# Patient Record
Sex: Male | Born: 1945
Health system: Southern US, Community
[De-identification: ages and names within clinical notes are randomized; demographics above are authoritative.]

## PROBLEM LIST (undated history)

## (undated) DIAGNOSIS — N529 Male erectile dysfunction, unspecified: Secondary | ICD-10-CM

## (undated) DIAGNOSIS — R52 Pain, unspecified: Secondary | ICD-10-CM

## (undated) DIAGNOSIS — D126 Benign neoplasm of colon, unspecified: Secondary | ICD-10-CM

## (undated) DIAGNOSIS — N183 Chronic kidney disease, stage 3 unspecified: Secondary | ICD-10-CM

## (undated) DIAGNOSIS — I2510 Atherosclerotic heart disease of native coronary artery without angina pectoris: Secondary | ICD-10-CM

## (undated) DIAGNOSIS — E119 Type 2 diabetes mellitus without complications: Secondary | ICD-10-CM

## (undated) DIAGNOSIS — R1013 Epigastric pain: Secondary | ICD-10-CM

## (undated) DIAGNOSIS — E785 Hyperlipidemia, unspecified: Secondary | ICD-10-CM

## (undated) DIAGNOSIS — M5430 Sciatica, unspecified side: Secondary | ICD-10-CM

## (undated) DIAGNOSIS — I739 Peripheral vascular disease, unspecified: Secondary | ICD-10-CM

## (undated) DIAGNOSIS — I10 Essential (primary) hypertension: Secondary | ICD-10-CM

## (undated) HISTORY — DX: Benign neoplasm of colon, unspecified: D12.6

## (undated) HISTORY — DX: Type 2 diabetes mellitus without complications: E11.9

## (undated) HISTORY — DX: Sciatica, unspecified side: M54.30

## (undated) HISTORY — PX: COLONOSCOPY: SHX174

## (undated) HISTORY — DX: Essential (primary) hypertension: I10

## (undated) HISTORY — DX: Pain, unspecified: R52

## (undated) HISTORY — DX: Chronic kidney disease, stage 3 unspecified: N18.30

## (undated) HISTORY — DX: Peripheral vascular disease, unspecified: I73.9

## (undated) HISTORY — DX: Male erectile dysfunction, unspecified: N52.9

## (undated) HISTORY — DX: Atherosclerotic heart disease of native coronary artery without angina pectoris: I25.10

## (undated) HISTORY — DX: Hyperlipidemia, unspecified: E78.5

## (undated) HISTORY — DX: Chronic kidney disease, stage 3 (moderate): N18.3

## (undated) HISTORY — DX: Epigastric pain: R10.13

---

## 2000-02-11 ENCOUNTER — Encounter: Admission: RE | Admit: 2000-02-11 | Discharge: 2000-05-11 | Payer: Self-pay | Admitting: Internal Medicine

## 2001-02-03 ENCOUNTER — Other Ambulatory Visit: Admission: RE | Admit: 2001-02-03 | Discharge: 2001-02-03 | Payer: Self-pay | Admitting: Internal Medicine

## 2001-02-11 ENCOUNTER — Encounter: Payer: Self-pay | Admitting: Emergency Medicine

## 2001-02-11 ENCOUNTER — Emergency Department (HOSPITAL_COMMUNITY): Admission: EM | Admit: 2001-02-11 | Discharge: 2001-02-11 | Payer: Self-pay | Admitting: Emergency Medicine

## 2005-11-25 DIAGNOSIS — D126 Benign neoplasm of colon, unspecified: Secondary | ICD-10-CM

## 2005-11-25 HISTORY — DX: Benign neoplasm of colon, unspecified: D12.6

## 2010-11-06 ENCOUNTER — Emergency Department (HOSPITAL_COMMUNITY)
Admission: EM | Admit: 2010-11-06 | Discharge: 2010-11-06 | Payer: Self-pay | Source: Home / Self Care | Admitting: Emergency Medicine

## 2011-06-25 ENCOUNTER — Other Ambulatory Visit: Payer: Self-pay | Admitting: Internal Medicine

## 2011-06-25 DIAGNOSIS — N189 Chronic kidney disease, unspecified: Secondary | ICD-10-CM

## 2011-07-05 ENCOUNTER — Ambulatory Visit
Admission: RE | Admit: 2011-07-05 | Discharge: 2011-07-05 | Disposition: A | Discharge status: Home / Self Care | Payer: Medicare Other | Source: Ambulatory Visit | Attending: Internal Medicine | Admitting: Internal Medicine

## 2011-07-05 DIAGNOSIS — N189 Chronic kidney disease, unspecified: Secondary | ICD-10-CM

## 2014-12-01 ENCOUNTER — Other Ambulatory Visit: Payer: Self-pay | Admitting: Gastroenterology

## 2014-12-13 ENCOUNTER — Encounter (HOSPITAL_COMMUNITY): Payer: Self-pay

## 2014-12-13 ENCOUNTER — Ambulatory Visit (HOSPITAL_COMMUNITY): Admit: 2014-12-13 | Payer: Self-pay | Admitting: Gastroenterology

## 2014-12-13 SURGERY — COLONOSCOPY WITH PROPOFOL
Anesthesia: Monitor Anesthesia Care

## 2014-12-23 DIAGNOSIS — E1121 Type 2 diabetes mellitus with diabetic nephropathy: Secondary | ICD-10-CM | POA: Diagnosis not present

## 2014-12-23 DIAGNOSIS — E1151 Type 2 diabetes mellitus with diabetic peripheral angiopathy without gangrene: Secondary | ICD-10-CM | POA: Diagnosis not present

## 2014-12-23 DIAGNOSIS — I739 Peripheral vascular disease, unspecified: Secondary | ICD-10-CM | POA: Diagnosis not present

## 2014-12-23 DIAGNOSIS — N183 Chronic kidney disease, stage 3 (moderate): Secondary | ICD-10-CM | POA: Diagnosis not present

## 2014-12-23 DIAGNOSIS — I10 Essential (primary) hypertension: Secondary | ICD-10-CM | POA: Diagnosis not present

## 2015-03-22 DIAGNOSIS — I739 Peripheral vascular disease, unspecified: Secondary | ICD-10-CM | POA: Diagnosis not present

## 2015-03-22 DIAGNOSIS — N183 Chronic kidney disease, stage 3 (moderate): Secondary | ICD-10-CM | POA: Diagnosis not present

## 2015-03-22 DIAGNOSIS — E1165 Type 2 diabetes mellitus with hyperglycemia: Secondary | ICD-10-CM | POA: Diagnosis not present

## 2015-03-22 DIAGNOSIS — Z794 Long term (current) use of insulin: Secondary | ICD-10-CM | POA: Diagnosis not present

## 2015-03-22 DIAGNOSIS — Z0000 Encounter for general adult medical examination without abnormal findings: Secondary | ICD-10-CM | POA: Diagnosis not present

## 2015-03-22 DIAGNOSIS — I2510 Atherosclerotic heart disease of native coronary artery without angina pectoris: Secondary | ICD-10-CM | POA: Diagnosis not present

## 2015-03-22 DIAGNOSIS — E1121 Type 2 diabetes mellitus with diabetic nephropathy: Secondary | ICD-10-CM | POA: Diagnosis not present

## 2015-03-22 DIAGNOSIS — E1151 Type 2 diabetes mellitus with diabetic peripheral angiopathy without gangrene: Secondary | ICD-10-CM | POA: Diagnosis not present

## 2015-07-24 DIAGNOSIS — E1121 Type 2 diabetes mellitus with diabetic nephropathy: Secondary | ICD-10-CM | POA: Diagnosis not present

## 2015-07-24 DIAGNOSIS — I739 Peripheral vascular disease, unspecified: Secondary | ICD-10-CM | POA: Diagnosis not present

## 2015-07-24 DIAGNOSIS — I129 Hypertensive chronic kidney disease with stage 1 through stage 4 chronic kidney disease, or unspecified chronic kidney disease: Secondary | ICD-10-CM | POA: Diagnosis not present

## 2015-07-24 DIAGNOSIS — Z794 Long term (current) use of insulin: Secondary | ICD-10-CM | POA: Diagnosis not present

## 2015-07-24 DIAGNOSIS — Z23 Encounter for immunization: Secondary | ICD-10-CM | POA: Diagnosis not present

## 2015-07-24 DIAGNOSIS — N183 Chronic kidney disease, stage 3 (moderate): Secondary | ICD-10-CM | POA: Diagnosis not present

## 2015-07-24 DIAGNOSIS — E1165 Type 2 diabetes mellitus with hyperglycemia: Secondary | ICD-10-CM | POA: Diagnosis not present

## 2015-07-24 DIAGNOSIS — E1151 Type 2 diabetes mellitus with diabetic peripheral angiopathy without gangrene: Secondary | ICD-10-CM | POA: Diagnosis not present

## 2015-12-13 DIAGNOSIS — H5213 Myopia, bilateral: Secondary | ICD-10-CM | POA: Diagnosis not present

## 2015-12-13 DIAGNOSIS — H524 Presbyopia: Secondary | ICD-10-CM | POA: Diagnosis not present

## 2015-12-13 DIAGNOSIS — E119 Type 2 diabetes mellitus without complications: Secondary | ICD-10-CM | POA: Diagnosis not present

## 2015-12-13 DIAGNOSIS — Z7984 Long term (current) use of oral hypoglycemic drugs: Secondary | ICD-10-CM | POA: Diagnosis not present

## 2015-12-13 DIAGNOSIS — Z794 Long term (current) use of insulin: Secondary | ICD-10-CM | POA: Diagnosis not present

## 2015-12-13 DIAGNOSIS — H5210 Myopia, unspecified eye: Secondary | ICD-10-CM | POA: Diagnosis not present

## 2015-12-13 DIAGNOSIS — H25099 Other age-related incipient cataract, unspecified eye: Secondary | ICD-10-CM | POA: Diagnosis not present

## 2015-12-13 DIAGNOSIS — H11159 Pinguecula, unspecified eye: Secondary | ICD-10-CM | POA: Diagnosis not present

## 2015-12-13 DIAGNOSIS — H52223 Regular astigmatism, bilateral: Secondary | ICD-10-CM | POA: Diagnosis not present

## 2015-12-13 DIAGNOSIS — I10 Essential (primary) hypertension: Secondary | ICD-10-CM | POA: Diagnosis not present

## 2015-12-29 DIAGNOSIS — Z794 Long term (current) use of insulin: Secondary | ICD-10-CM | POA: Diagnosis not present

## 2015-12-29 DIAGNOSIS — E1151 Type 2 diabetes mellitus with diabetic peripheral angiopathy without gangrene: Secondary | ICD-10-CM | POA: Diagnosis not present

## 2015-12-29 DIAGNOSIS — I739 Peripheral vascular disease, unspecified: Secondary | ICD-10-CM | POA: Diagnosis not present

## 2015-12-29 DIAGNOSIS — E1165 Type 2 diabetes mellitus with hyperglycemia: Secondary | ICD-10-CM | POA: Diagnosis not present

## 2015-12-29 DIAGNOSIS — N183 Chronic kidney disease, stage 3 (moderate): Secondary | ICD-10-CM | POA: Diagnosis not present

## 2015-12-29 DIAGNOSIS — I129 Hypertensive chronic kidney disease with stage 1 through stage 4 chronic kidney disease, or unspecified chronic kidney disease: Secondary | ICD-10-CM | POA: Diagnosis not present

## 2015-12-29 DIAGNOSIS — R3915 Urgency of urination: Secondary | ICD-10-CM | POA: Diagnosis not present

## 2015-12-29 DIAGNOSIS — E1121 Type 2 diabetes mellitus with diabetic nephropathy: Secondary | ICD-10-CM | POA: Diagnosis not present

## 2016-05-01 DIAGNOSIS — N183 Chronic kidney disease, stage 3 (moderate): Secondary | ICD-10-CM | POA: Diagnosis not present

## 2016-05-01 DIAGNOSIS — Z6838 Body mass index (BMI) 38.0-38.9, adult: Secondary | ICD-10-CM | POA: Diagnosis not present

## 2016-05-01 DIAGNOSIS — Z1389 Encounter for screening for other disorder: Secondary | ICD-10-CM | POA: Diagnosis not present

## 2016-05-01 DIAGNOSIS — Z1211 Encounter for screening for malignant neoplasm of colon: Secondary | ICD-10-CM | POA: Diagnosis not present

## 2016-05-01 DIAGNOSIS — Z0000 Encounter for general adult medical examination without abnormal findings: Secondary | ICD-10-CM | POA: Diagnosis not present

## 2016-05-01 DIAGNOSIS — I739 Peripheral vascular disease, unspecified: Secondary | ICD-10-CM | POA: Diagnosis not present

## 2016-05-01 DIAGNOSIS — E1121 Type 2 diabetes mellitus with diabetic nephropathy: Secondary | ICD-10-CM | POA: Diagnosis not present

## 2016-05-01 DIAGNOSIS — E1151 Type 2 diabetes mellitus with diabetic peripheral angiopathy without gangrene: Secondary | ICD-10-CM | POA: Diagnosis not present

## 2016-05-01 DIAGNOSIS — E7800 Pure hypercholesterolemia, unspecified: Secondary | ICD-10-CM | POA: Diagnosis not present

## 2016-05-01 DIAGNOSIS — I2510 Atherosclerotic heart disease of native coronary artery without angina pectoris: Secondary | ICD-10-CM | POA: Diagnosis not present

## 2016-05-01 DIAGNOSIS — I10 Essential (primary) hypertension: Secondary | ICD-10-CM | POA: Diagnosis not present

## 2016-09-02 DIAGNOSIS — I129 Hypertensive chronic kidney disease with stage 1 through stage 4 chronic kidney disease, or unspecified chronic kidney disease: Secondary | ICD-10-CM | POA: Diagnosis not present

## 2016-09-02 DIAGNOSIS — Z794 Long term (current) use of insulin: Secondary | ICD-10-CM | POA: Diagnosis not present

## 2016-09-02 DIAGNOSIS — I739 Peripheral vascular disease, unspecified: Secondary | ICD-10-CM | POA: Diagnosis not present

## 2016-09-02 DIAGNOSIS — N183 Chronic kidney disease, stage 3 (moderate): Secondary | ICD-10-CM | POA: Diagnosis not present

## 2016-09-02 DIAGNOSIS — E1151 Type 2 diabetes mellitus with diabetic peripheral angiopathy without gangrene: Secondary | ICD-10-CM | POA: Diagnosis not present

## 2016-09-02 DIAGNOSIS — E1121 Type 2 diabetes mellitus with diabetic nephropathy: Secondary | ICD-10-CM | POA: Diagnosis not present

## 2016-09-02 DIAGNOSIS — Z23 Encounter for immunization: Secondary | ICD-10-CM | POA: Diagnosis not present

## 2016-10-30 ENCOUNTER — Other Ambulatory Visit: Payer: Self-pay | Admitting: Gastroenterology

## 2016-12-23 DIAGNOSIS — H524 Presbyopia: Secondary | ICD-10-CM | POA: Diagnosis not present

## 2016-12-23 DIAGNOSIS — E119 Type 2 diabetes mellitus without complications: Secondary | ICD-10-CM | POA: Diagnosis not present

## 2017-01-14 ENCOUNTER — Ambulatory Visit (HOSPITAL_COMMUNITY): Payer: Medicare HMO | Admitting: Registered Nurse

## 2017-01-14 ENCOUNTER — Encounter (HOSPITAL_COMMUNITY)
Admission: RE | Disposition: A | Discharge status: Home / Self Care | Payer: Self-pay | Source: Ambulatory Visit | Attending: Gastroenterology

## 2017-01-14 ENCOUNTER — Encounter (HOSPITAL_COMMUNITY): Payer: Self-pay

## 2017-01-14 ENCOUNTER — Ambulatory Visit (HOSPITAL_COMMUNITY)
Admission: RE | Admit: 2017-01-14 | Discharge: 2017-01-14 | Disposition: A | Discharge status: Home / Self Care | Payer: Medicare HMO | Source: Ambulatory Visit | Attending: Gastroenterology | Admitting: Gastroenterology

## 2017-01-14 DIAGNOSIS — E1151 Type 2 diabetes mellitus with diabetic peripheral angiopathy without gangrene: Secondary | ICD-10-CM | POA: Diagnosis not present

## 2017-01-14 DIAGNOSIS — K621 Rectal polyp: Secondary | ICD-10-CM | POA: Insufficient documentation

## 2017-01-14 DIAGNOSIS — J449 Chronic obstructive pulmonary disease, unspecified: Secondary | ICD-10-CM | POA: Diagnosis not present

## 2017-01-14 DIAGNOSIS — I2510 Atherosclerotic heart disease of native coronary artery without angina pectoris: Secondary | ICD-10-CM | POA: Insufficient documentation

## 2017-01-14 DIAGNOSIS — D120 Benign neoplasm of cecum: Secondary | ICD-10-CM | POA: Diagnosis not present

## 2017-01-14 DIAGNOSIS — E7800 Pure hypercholesterolemia, unspecified: Secondary | ICD-10-CM | POA: Diagnosis not present

## 2017-01-14 DIAGNOSIS — Z1211 Encounter for screening for malignant neoplasm of colon: Secondary | ICD-10-CM | POA: Diagnosis not present

## 2017-01-14 DIAGNOSIS — B182 Chronic viral hepatitis C: Secondary | ICD-10-CM | POA: Diagnosis not present

## 2017-01-14 DIAGNOSIS — D122 Benign neoplasm of ascending colon: Secondary | ICD-10-CM | POA: Insufficient documentation

## 2017-01-14 DIAGNOSIS — D124 Benign neoplasm of descending colon: Secondary | ICD-10-CM | POA: Insufficient documentation

## 2017-01-14 DIAGNOSIS — Z87891 Personal history of nicotine dependence: Secondary | ICD-10-CM | POA: Diagnosis not present

## 2017-01-14 DIAGNOSIS — Z955 Presence of coronary angioplasty implant and graft: Secondary | ICD-10-CM | POA: Diagnosis not present

## 2017-01-14 DIAGNOSIS — Z6838 Body mass index (BMI) 38.0-38.9, adult: Secondary | ICD-10-CM | POA: Insufficient documentation

## 2017-01-14 DIAGNOSIS — Z86010 Personal history of colonic polyps: Secondary | ICD-10-CM | POA: Diagnosis not present

## 2017-01-14 HISTORY — PX: COLONOSCOPY WITH PROPOFOL: SHX5780

## 2017-01-14 LAB — GLUCOSE, CAPILLARY: GLUCOSE-CAPILLARY: 81 mg/dL (ref 65–99)

## 2017-01-14 SURGERY — COLONOSCOPY WITH PROPOFOL
Anesthesia: Monitor Anesthesia Care

## 2017-01-14 MED ORDER — LIDOCAINE 2% (20 MG/ML) 5 ML SYRINGE
INTRAMUSCULAR | Status: AC
  Filled 2017-01-14: qty 5

## 2017-01-14 MED ORDER — ONDANSETRON HCL 4 MG/2ML IJ SOLN
INTRAMUSCULAR | Status: DC | PRN
  Administered 2017-01-14: 4 mg via INTRAVENOUS

## 2017-01-14 MED ORDER — PROPOFOL 10 MG/ML IV BOLUS
INTRAVENOUS | Status: DC | PRN
  Administered 2017-01-14 (×2): 20 mg via INTRAVENOUS

## 2017-01-14 MED ORDER — SODIUM CHLORIDE 0.9 % IV SOLN: INTRAVENOUS | Status: DC

## 2017-01-14 MED ORDER — ONDANSETRON HCL 4 MG/2ML IJ SOLN
INTRAMUSCULAR | Status: AC
  Filled 2017-01-14: qty 2

## 2017-01-14 MED ORDER — PROPOFOL 500 MG/50ML IV EMUL
INTRAVENOUS | Status: DC | PRN
  Administered 2017-01-14: 100 ug/kg/min via INTRAVENOUS

## 2017-01-14 MED ORDER — LACTATED RINGERS IV SOLN
INTRAVENOUS | Status: DC
  Administered 2017-01-14: 1000 mL via INTRAVENOUS

## 2017-01-14 MED ORDER — LIDOCAINE 2% (20 MG/ML) 5 ML SYRINGE
INTRAMUSCULAR | Status: DC | PRN
  Administered 2017-01-14: 50 mg via INTRAVENOUS

## 2017-01-14 MED ORDER — PROPOFOL 10 MG/ML IV BOLUS
INTRAVENOUS | Status: AC
  Filled 2017-01-14: qty 60

## 2017-01-14 SURGICAL SUPPLY — 22 items

## 2017-01-14 NOTE — Transfer of Care (Signed)
Immediate Anesthesia Transfer of Care Note  Patient: Kyle Delgado  Procedure(s) Performed: Procedure(s): COLONOSCOPY WITH PROPOFOL (N/A)  Patient Location: PACU  Anesthesia Type:MAC  Level of Consciousness:  sedated, patient cooperative and responds to stimulation  Airway & Oxygen Therapy:Patient Spontanous Breathing and Patient connected to face mask oxgen  Post-op Assessment:  Report given to PACU RN and Post -op Vital signs reviewed and stable  Post vital signs:  Reviewed and stable  Last Vitals:  Vitals:   01/14/17 0645  BP: (!) 156/80  Pulse: 72  Resp: 15  Temp: 67.8 C    Complications: No apparent anesthesia complications

## 2017-01-14 NOTE — Anesthesia Preprocedure Evaluation (Addendum)
Anesthesia Evaluation  Patient identified by MRN, date of birth, ID band Patient awake    Reviewed: Allergy & Precautions, NPO status , Patient's Chart, lab work & pertinent test results  History of Anesthesia Complications Negative for: history of anesthetic complications  Airway Mallampati: II  TM Distance: >3 FB Neck ROM: Full    Dental  (+) Caps, Missing, Dental Advisory Given   Pulmonary COPD, former smoker,    breath sounds clear to auscultation       Cardiovascular hypertension, Pt. on medications (-) angina+ CAD and + Cardiac Stents (at [facility], no records)   Rhythm:Regular Rate:Normal     Neuro/Psych negative neurological ROS     GI/Hepatic negative GI ROS, Neg liver ROS,   Endo/Other  diabetes (glu 81), Insulin DependentMorbid obesity  Renal/GU Renal InsufficiencyRenal disease     Musculoskeletal   Abdominal (+) + obese,   Peds  Hematology   Anesthesia Other Findings   Reproductive/Obstetrics                            Anesthesia Physical Anesthesia Plan  ASA: III  Anesthesia Plan: MAC   Post-op Pain Management:    Induction: Intravenous  Airway Management Planned: Natural Airway  Additional Equipment:   Intra-op Plan:   Post-operative Plan:   Informed Consent: I have reviewed the patients History and Physical, chart, labs and discussed the procedure including the risks, benefits and alternatives for the proposed anesthesia with the patient or authorized representative who has indicated his/her understanding and acceptance.   Dental advisory given  Plan Discussed with: CRNA and Surgeon  Anesthesia Plan Comments: (Plan routine monitors, MAC)        Anesthesia Quick Evaluation

## 2017-01-14 NOTE — Anesthesia Postprocedure Evaluation (Signed)
Anesthesia Post Note  Patient: Kyle Delgado  Procedure(s) Performed: Procedure(s) (LRB): COLONOSCOPY WITH PROPOFOL (N/A)  Patient location during evaluation: Endoscopy Anesthesia Type: MAC Level of consciousness: awake and alert, oriented and patient cooperative Pain management: pain level controlled Vital Signs Assessment: post-procedure vital signs reviewed and stable Respiratory status: spontaneous breathing, nonlabored ventilation and respiratory function stable Cardiovascular status: blood pressure returned to baseline and stable Postop Assessment: no signs of nausea or vomiting Anesthetic complications: no       Last Vitals:  Vitals:   01/14/17 0645 01/14/17 0835  BP: (!) 156/80 (!) 145/84  Pulse: 72 77  Resp: 15 (!) 25  Temp: 36.6 C 36.6 C    Last Pain:  Vitals:   01/14/17 0835  TempSrc: Axillary                 Edwinna Rochette,E. Alanson Hausmann

## 2017-01-14 NOTE — H&P (Signed)
Procedure: Surveillance colonoscopy. 11/12/2011 normal surveillance colonoscopy was performed. On 08/21/2006 screening colonoscopy was performed with removal of a small pedunculated adenomatous sigmoid colon polyp.  History: The patient is a 71 year old male born Dec 09, 1945. He is scheduled to undergo a surveillance colonoscopy today.  Past medical history: Chronic hepatitis C genotype 1A diagnosed in 2000. Blood transfusion performed in 1977. Interferon plus ribavirin therapy administered from November 2002 until February 2003. Type 2 diabetes mellitus. Coronary artery disease. Drug eluting coronary artery stent placed in the left anterior descending coronary artery in March 2012. Stage III kidney disease. Hypertension. Hypercholesterolemia. Peripheral vascular disease.  Medication allergy: Januvia caused pancreatitis  Exam: The patient is alert and lying comfortably on the endoscopy stretcher. Abdomen is soft and nontender to palpation. Lungs are clear to auscultation. Cardiac exam reveals a regular rhythm.  Plan: Proceed with surveillance colonoscopy

## 2017-01-14 NOTE — Discharge Instructions (Signed)

## 2017-01-14 NOTE — Op Note (Signed)
[facility] Patient Name: Kyle Delgado Procedure Date: 01/14/2017 MRN: [id number] Attending MD: Garlan Fair , MD Date of Birth: [birth year] CSN: [id number] Age: 71 Admit Type: Inpatient Procedure:                Colonoscopy Indications:              High risk colon cancer surveillance: Personal                            history of non-advanced adenoma Providers:                Garlan Fair, MD, Laverta Baltimore RN, RN,                            Cherylynn Ridges, Technician, Lance Coon, CRNA Referring MD:              Medicines:                Propofol per Anesthesia Complications:            No immediate complications. Estimated Blood Loss:     Estimated blood loss was minimal. Procedure:                Pre-Anesthesia Assessment:                           - Prior to the procedure, a History and Physical                            was performed, and patient medications and                            allergies were reviewed. The patient's tolerance of                            previous anesthesia was also reviewed. The risks                            and benefits of the procedure and the sedation                            options and risks were discussed with the patient.                            All questions were answered, and informed consent                            was obtained. Prior Anticoagulants: The patient has                            taken aspirin, last dose was day of procedure. ASA                            Grade Assessment: III - A patient with severe  systemic disease. After reviewing the risks and                            benefits, the patient was deemed in satisfactory                            condition to undergo the procedure.                           After obtaining informed consent, the colonoscope                            was passed under direct vision. Throughout the   procedure, the patient's blood pressure, pulse, and                            oxygen saturations were monitored continuously. The                            EC-[id number]) scope was introduced through                            the anus and advanced to the the cecum, identified                            by appendiceal orifice and ileocecal valve. The                            colonoscopy was performed without difficulty. The                            patient tolerated the procedure well. The quality                            of the bowel preparation was good. The appendiceal                            orifice and the rectum were photographed. Scope In: 8:01:11 AM Scope Out: 8:31:09 AM Scope Withdrawal Time: 0 hours 25 minutes 53 seconds  Total Procedure Duration: 0 hours 29 minutes 58 seconds  Findings:      The perianal and digital rectal examinations were normal.      A 3 mm polyp was found in the cecum. The polyp was sessile. The polyp       was removed with a cold biopsy forceps. Resection and retrieval were       complete.      A 3 mm polyp was found in the ascending colon. The polyp was sessile.       The polyp was removed with a cold biopsy forceps. Resection and       retrieval were complete.      A 5 mm polyp was found in the descending colon. The polyp was sessile.       The polyp was removed with a cold snare. Resection and retrieval were       complete.      A  5 mm polyp was found in the rectum. The polyp was sessile. The polyp       was removed with a cold snare. Resection and retrieval were complete.      The exam was otherwise without abnormality. Impression:               - One 3 mm polyp in the cecum, removed with a cold                            biopsy forceps. Resected and retrieved.                           - One 3 mm polyp in the ascending colon, removed                            with a cold biopsy forceps. Resected and retrieved.                            - One 5 mm polyp in the descending colon, removed                            with a cold snare. Resected and retrieved.                           - One 5 mm polyp in the rectum, removed with a cold                            snare. Resected and retrieved.                           - The examination was otherwise normal. Moderate Sedation:      N/A- Per Anesthesia Care Recommendation:           - Patient has a contact number available for                            emergencies. The signs and symptoms of potential                            delayed complications were discussed with the                            patient. Return to normal activities tomorrow.                            Written discharge instructions were provided to the                            patient.                           - Repeat colonoscopy date to be determined after                            pending pathology results  are reviewed for                            surveillance.                           - Resume previous diet.                           - Continue present medications. Procedure Code(s):        --- Professional ---                           [phone], Colonoscopy, flexible; with removal of                            tumor(s), polyp(s), or other lesion(s) by snare                            technique                           45380, 24, Colonoscopy, flexible; with biopsy,                            single or multiple Diagnosis Code(s):        --- Professional ---                           Z86.010, Personal history of colonic polyps                           D12.0, Benign neoplasm of cecum                           D12.2, Benign neoplasm of ascending colon                           D12.4, Benign neoplasm of descending colon                           K62.1, Rectal polyp CPT copyright 2016 American Medical Association. All rights reserved. The codes documented in this report are preliminary and upon  coder review may  be revised to meet current compliance requirements. Earle Gell, MD Garlan Fair, MD 01/14/2017 8:36:46 AM This report has been signed electronically. Number of Addenda: 0

## 2017-01-15 ENCOUNTER — Encounter (HOSPITAL_COMMUNITY): Payer: Self-pay | Admitting: Gastroenterology

## 2017-01-15 DIAGNOSIS — E1151 Type 2 diabetes mellitus with diabetic peripheral angiopathy without gangrene: Secondary | ICD-10-CM | POA: Diagnosis not present

## 2017-01-15 DIAGNOSIS — Z794 Long term (current) use of insulin: Secondary | ICD-10-CM | POA: Diagnosis not present

## 2017-01-15 DIAGNOSIS — I129 Hypertensive chronic kidney disease with stage 1 through stage 4 chronic kidney disease, or unspecified chronic kidney disease: Secondary | ICD-10-CM | POA: Diagnosis not present

## 2017-01-15 DIAGNOSIS — I739 Peripheral vascular disease, unspecified: Secondary | ICD-10-CM | POA: Diagnosis not present

## 2017-01-15 DIAGNOSIS — N183 Chronic kidney disease, stage 3 (moderate): Secondary | ICD-10-CM | POA: Diagnosis not present

## 2017-01-15 DIAGNOSIS — E1165 Type 2 diabetes mellitus with hyperglycemia: Secondary | ICD-10-CM | POA: Diagnosis not present

## 2017-01-15 DIAGNOSIS — E1121 Type 2 diabetes mellitus with diabetic nephropathy: Secondary | ICD-10-CM | POA: Diagnosis not present

## 2017-05-13 DIAGNOSIS — E1121 Type 2 diabetes mellitus with diabetic nephropathy: Secondary | ICD-10-CM | POA: Diagnosis not present

## 2017-05-13 DIAGNOSIS — I739 Peripheral vascular disease, unspecified: Secondary | ICD-10-CM | POA: Diagnosis not present

## 2017-05-13 DIAGNOSIS — Z0000 Encounter for general adult medical examination without abnormal findings: Secondary | ICD-10-CM | POA: Diagnosis not present

## 2017-05-13 DIAGNOSIS — N183 Chronic kidney disease, stage 3 (moderate): Secondary | ICD-10-CM | POA: Diagnosis not present

## 2017-05-13 DIAGNOSIS — Z6839 Body mass index (BMI) 39.0-39.9, adult: Secondary | ICD-10-CM | POA: Diagnosis not present

## 2017-05-13 DIAGNOSIS — Z1389 Encounter for screening for other disorder: Secondary | ICD-10-CM | POA: Diagnosis not present

## 2017-05-13 DIAGNOSIS — Z794 Long term (current) use of insulin: Secondary | ICD-10-CM | POA: Diagnosis not present

## 2017-05-13 DIAGNOSIS — I129 Hypertensive chronic kidney disease with stage 1 through stage 4 chronic kidney disease, or unspecified chronic kidney disease: Secondary | ICD-10-CM | POA: Diagnosis not present

## 2017-05-13 DIAGNOSIS — E7800 Pure hypercholesterolemia, unspecified: Secondary | ICD-10-CM | POA: Diagnosis not present

## 2017-05-13 DIAGNOSIS — E1151 Type 2 diabetes mellitus with diabetic peripheral angiopathy without gangrene: Secondary | ICD-10-CM | POA: Diagnosis not present

## 2017-05-13 DIAGNOSIS — Z7189 Other specified counseling: Secondary | ICD-10-CM | POA: Diagnosis not present

## 2018-01-07 DIAGNOSIS — H524 Presbyopia: Secondary | ICD-10-CM | POA: Diagnosis not present

## 2018-01-07 DIAGNOSIS — E119 Type 2 diabetes mellitus without complications: Secondary | ICD-10-CM | POA: Diagnosis not present

## 2018-01-13 DIAGNOSIS — E1165 Type 2 diabetes mellitus with hyperglycemia: Secondary | ICD-10-CM | POA: Diagnosis not present

## 2018-01-13 DIAGNOSIS — E1121 Type 2 diabetes mellitus with diabetic nephropathy: Secondary | ICD-10-CM | POA: Diagnosis not present

## 2018-01-13 DIAGNOSIS — I739 Peripheral vascular disease, unspecified: Secondary | ICD-10-CM | POA: Diagnosis not present

## 2018-01-13 DIAGNOSIS — E1151 Type 2 diabetes mellitus with diabetic peripheral angiopathy without gangrene: Secondary | ICD-10-CM | POA: Diagnosis not present

## 2018-01-13 DIAGNOSIS — R400 Somnolence: Secondary | ICD-10-CM | POA: Diagnosis not present

## 2018-01-13 DIAGNOSIS — I129 Hypertensive chronic kidney disease with stage 1 through stage 4 chronic kidney disease, or unspecified chronic kidney disease: Secondary | ICD-10-CM | POA: Diagnosis not present

## 2018-01-13 DIAGNOSIS — N183 Chronic kidney disease, stage 3 (moderate): Secondary | ICD-10-CM | POA: Diagnosis not present

## 2018-01-13 DIAGNOSIS — Z794 Long term (current) use of insulin: Secondary | ICD-10-CM | POA: Diagnosis not present

## 2018-01-20 DIAGNOSIS — E1165 Type 2 diabetes mellitus with hyperglycemia: Secondary | ICD-10-CM | POA: Diagnosis not present

## 2018-01-20 DIAGNOSIS — I2510 Atherosclerotic heart disease of native coronary artery without angina pectoris: Secondary | ICD-10-CM | POA: Diagnosis not present

## 2018-01-20 DIAGNOSIS — F329 Major depressive disorder, single episode, unspecified: Secondary | ICD-10-CM | POA: Diagnosis not present

## 2018-01-20 DIAGNOSIS — N183 Chronic kidney disease, stage 3 (moderate): Secondary | ICD-10-CM | POA: Diagnosis not present

## 2018-01-20 DIAGNOSIS — E1121 Type 2 diabetes mellitus with diabetic nephropathy: Secondary | ICD-10-CM | POA: Diagnosis not present

## 2018-01-20 DIAGNOSIS — Z794 Long term (current) use of insulin: Secondary | ICD-10-CM | POA: Diagnosis not present

## 2018-01-20 DIAGNOSIS — E1151 Type 2 diabetes mellitus with diabetic peripheral angiopathy without gangrene: Secondary | ICD-10-CM | POA: Diagnosis not present

## 2018-02-06 DIAGNOSIS — E1165 Type 2 diabetes mellitus with hyperglycemia: Secondary | ICD-10-CM | POA: Diagnosis not present

## 2018-02-06 DIAGNOSIS — N183 Chronic kidney disease, stage 3 (moderate): Secondary | ICD-10-CM | POA: Diagnosis not present

## 2018-02-06 DIAGNOSIS — Z794 Long term (current) use of insulin: Secondary | ICD-10-CM | POA: Diagnosis not present

## 2018-02-06 DIAGNOSIS — E1121 Type 2 diabetes mellitus with diabetic nephropathy: Secondary | ICD-10-CM | POA: Diagnosis not present

## 2018-02-06 DIAGNOSIS — F329 Major depressive disorder, single episode, unspecified: Secondary | ICD-10-CM | POA: Diagnosis not present

## 2018-02-06 DIAGNOSIS — I2510 Atherosclerotic heart disease of native coronary artery without angina pectoris: Secondary | ICD-10-CM | POA: Diagnosis not present

## 2018-02-06 DIAGNOSIS — I129 Hypertensive chronic kidney disease with stage 1 through stage 4 chronic kidney disease, or unspecified chronic kidney disease: Secondary | ICD-10-CM | POA: Diagnosis not present

## 2018-02-06 DIAGNOSIS — E1151 Type 2 diabetes mellitus with diabetic peripheral angiopathy without gangrene: Secondary | ICD-10-CM | POA: Diagnosis not present

## 2018-05-19 ENCOUNTER — Other Ambulatory Visit: Payer: Self-pay | Admitting: Internal Medicine

## 2018-05-19 ENCOUNTER — Ambulatory Visit
Admission: RE | Admit: 2018-05-19 | Discharge: 2018-05-19 | Disposition: A | Discharge status: Home / Self Care | Payer: Medicare HMO | Source: Ambulatory Visit | Attending: Internal Medicine | Admitting: Internal Medicine

## 2018-05-19 DIAGNOSIS — M25551 Pain in right hip: Secondary | ICD-10-CM

## 2018-05-19 DIAGNOSIS — I739 Peripheral vascular disease, unspecified: Secondary | ICD-10-CM | POA: Diagnosis not present

## 2018-05-19 DIAGNOSIS — I129 Hypertensive chronic kidney disease with stage 1 through stage 4 chronic kidney disease, or unspecified chronic kidney disease: Secondary | ICD-10-CM | POA: Diagnosis not present

## 2018-05-19 DIAGNOSIS — G4733 Obstructive sleep apnea (adult) (pediatric): Secondary | ICD-10-CM | POA: Diagnosis not present

## 2018-05-19 DIAGNOSIS — E7800 Pure hypercholesterolemia, unspecified: Secondary | ICD-10-CM | POA: Diagnosis not present

## 2018-05-19 DIAGNOSIS — Z7189 Other specified counseling: Secondary | ICD-10-CM | POA: Diagnosis not present

## 2018-05-19 DIAGNOSIS — E1151 Type 2 diabetes mellitus with diabetic peripheral angiopathy without gangrene: Secondary | ICD-10-CM | POA: Diagnosis not present

## 2018-05-19 DIAGNOSIS — E1121 Type 2 diabetes mellitus with diabetic nephropathy: Secondary | ICD-10-CM | POA: Diagnosis not present

## 2018-05-19 DIAGNOSIS — N183 Chronic kidney disease, stage 3 (moderate): Secondary | ICD-10-CM | POA: Diagnosis not present

## 2018-05-19 DIAGNOSIS — M1611 Unilateral primary osteoarthritis, right hip: Secondary | ICD-10-CM | POA: Diagnosis not present

## 2018-05-19 DIAGNOSIS — E1122 Type 2 diabetes mellitus with diabetic chronic kidney disease: Secondary | ICD-10-CM | POA: Diagnosis not present

## 2018-05-19 DIAGNOSIS — Z794 Long term (current) use of insulin: Secondary | ICD-10-CM | POA: Diagnosis not present

## 2018-05-19 DIAGNOSIS — F329 Major depressive disorder, single episode, unspecified: Secondary | ICD-10-CM | POA: Diagnosis not present

## 2018-05-19 DIAGNOSIS — Z0000 Encounter for general adult medical examination without abnormal findings: Secondary | ICD-10-CM | POA: Diagnosis not present

## 2018-05-19 DIAGNOSIS — E1165 Type 2 diabetes mellitus with hyperglycemia: Secondary | ICD-10-CM | POA: Diagnosis not present

## 2018-05-19 DIAGNOSIS — Z1389 Encounter for screening for other disorder: Secondary | ICD-10-CM | POA: Diagnosis not present

## 2018-06-23 DIAGNOSIS — I2510 Atherosclerotic heart disease of native coronary artery without angina pectoris: Secondary | ICD-10-CM | POA: Diagnosis not present

## 2018-06-23 DIAGNOSIS — F329 Major depressive disorder, single episode, unspecified: Secondary | ICD-10-CM | POA: Diagnosis not present

## 2018-06-23 DIAGNOSIS — N183 Chronic kidney disease, stage 3 (moderate): Secondary | ICD-10-CM | POA: Diagnosis not present

## 2018-06-23 DIAGNOSIS — E1151 Type 2 diabetes mellitus with diabetic peripheral angiopathy without gangrene: Secondary | ICD-10-CM | POA: Diagnosis not present

## 2018-06-23 DIAGNOSIS — E1121 Type 2 diabetes mellitus with diabetic nephropathy: Secondary | ICD-10-CM | POA: Diagnosis not present

## 2018-06-23 DIAGNOSIS — Z794 Long term (current) use of insulin: Secondary | ICD-10-CM | POA: Diagnosis not present

## 2018-09-23 DIAGNOSIS — I10 Essential (primary) hypertension: Secondary | ICD-10-CM | POA: Diagnosis not present

## 2018-09-23 DIAGNOSIS — G4733 Obstructive sleep apnea (adult) (pediatric): Secondary | ICD-10-CM | POA: Diagnosis not present

## 2018-09-23 DIAGNOSIS — E1121 Type 2 diabetes mellitus with diabetic nephropathy: Secondary | ICD-10-CM | POA: Diagnosis not present

## 2018-10-21 DIAGNOSIS — I129 Hypertensive chronic kidney disease with stage 1 through stage 4 chronic kidney disease, or unspecified chronic kidney disease: Secondary | ICD-10-CM | POA: Diagnosis not present

## 2018-10-21 DIAGNOSIS — I2510 Atherosclerotic heart disease of native coronary artery without angina pectoris: Secondary | ICD-10-CM | POA: Diagnosis not present

## 2018-10-21 DIAGNOSIS — N183 Chronic kidney disease, stage 3 (moderate): Secondary | ICD-10-CM | POA: Diagnosis not present

## 2018-10-21 DIAGNOSIS — F329 Major depressive disorder, single episode, unspecified: Secondary | ICD-10-CM | POA: Diagnosis not present

## 2018-10-21 DIAGNOSIS — E1151 Type 2 diabetes mellitus with diabetic peripheral angiopathy without gangrene: Secondary | ICD-10-CM | POA: Diagnosis not present

## 2018-10-21 DIAGNOSIS — Z794 Long term (current) use of insulin: Secondary | ICD-10-CM | POA: Diagnosis not present

## 2018-10-21 DIAGNOSIS — E1121 Type 2 diabetes mellitus with diabetic nephropathy: Secondary | ICD-10-CM | POA: Diagnosis not present

## 2018-10-21 DIAGNOSIS — E1165 Type 2 diabetes mellitus with hyperglycemia: Secondary | ICD-10-CM | POA: Diagnosis not present

## 2018-11-24 DIAGNOSIS — E1121 Type 2 diabetes mellitus with diabetic nephropathy: Secondary | ICD-10-CM | POA: Diagnosis not present

## 2018-11-24 DIAGNOSIS — I129 Hypertensive chronic kidney disease with stage 1 through stage 4 chronic kidney disease, or unspecified chronic kidney disease: Secondary | ICD-10-CM | POA: Diagnosis not present

## 2018-11-24 DIAGNOSIS — F329 Major depressive disorder, single episode, unspecified: Secondary | ICD-10-CM | POA: Diagnosis not present

## 2018-11-24 DIAGNOSIS — E1151 Type 2 diabetes mellitus with diabetic peripheral angiopathy without gangrene: Secondary | ICD-10-CM | POA: Diagnosis not present

## 2018-11-24 DIAGNOSIS — I2510 Atherosclerotic heart disease of native coronary artery without angina pectoris: Secondary | ICD-10-CM | POA: Diagnosis not present

## 2018-11-24 DIAGNOSIS — N183 Chronic kidney disease, stage 3 (moderate): Secondary | ICD-10-CM | POA: Diagnosis not present

## 2018-11-24 DIAGNOSIS — E1165 Type 2 diabetes mellitus with hyperglycemia: Secondary | ICD-10-CM | POA: Diagnosis not present

## 2019-01-12 DIAGNOSIS — H25099 Other age-related incipient cataract, unspecified eye: Secondary | ICD-10-CM | POA: Diagnosis not present

## 2019-01-12 DIAGNOSIS — Z794 Long term (current) use of insulin: Secondary | ICD-10-CM | POA: Diagnosis not present

## 2019-01-12 DIAGNOSIS — H5203 Hypermetropia, bilateral: Secondary | ICD-10-CM | POA: Diagnosis not present

## 2019-01-12 DIAGNOSIS — E119 Type 2 diabetes mellitus without complications: Secondary | ICD-10-CM | POA: Diagnosis not present

## 2019-01-12 DIAGNOSIS — Z0100 Encounter for examination of eyes and vision without abnormal findings: Secondary | ICD-10-CM | POA: Diagnosis not present

## 2019-01-12 DIAGNOSIS — H524 Presbyopia: Secondary | ICD-10-CM | POA: Diagnosis not present

## 2019-01-12 DIAGNOSIS — I10 Essential (primary) hypertension: Secondary | ICD-10-CM | POA: Diagnosis not present

## 2019-01-12 DIAGNOSIS — H52223 Regular astigmatism, bilateral: Secondary | ICD-10-CM | POA: Diagnosis not present

## 2019-01-12 DIAGNOSIS — H11159 Pinguecula, unspecified eye: Secondary | ICD-10-CM | POA: Diagnosis not present

## 2019-01-21 DIAGNOSIS — E1151 Type 2 diabetes mellitus with diabetic peripheral angiopathy without gangrene: Secondary | ICD-10-CM | POA: Diagnosis not present

## 2019-01-21 DIAGNOSIS — E1121 Type 2 diabetes mellitus with diabetic nephropathy: Secondary | ICD-10-CM | POA: Diagnosis not present

## 2019-01-21 DIAGNOSIS — I739 Peripheral vascular disease, unspecified: Secondary | ICD-10-CM | POA: Diagnosis not present

## 2019-01-21 DIAGNOSIS — E1122 Type 2 diabetes mellitus with diabetic chronic kidney disease: Secondary | ICD-10-CM | POA: Diagnosis not present

## 2019-01-21 DIAGNOSIS — N183 Chronic kidney disease, stage 3 (moderate): Secondary | ICD-10-CM | POA: Diagnosis not present

## 2019-01-21 DIAGNOSIS — Z794 Long term (current) use of insulin: Secondary | ICD-10-CM | POA: Diagnosis not present

## 2019-01-21 DIAGNOSIS — I10 Essential (primary) hypertension: Secondary | ICD-10-CM | POA: Diagnosis not present

## 2019-02-19 DIAGNOSIS — E1165 Type 2 diabetes mellitus with hyperglycemia: Secondary | ICD-10-CM | POA: Diagnosis not present

## 2019-02-19 DIAGNOSIS — I129 Hypertensive chronic kidney disease with stage 1 through stage 4 chronic kidney disease, or unspecified chronic kidney disease: Secondary | ICD-10-CM | POA: Diagnosis not present

## 2019-02-19 DIAGNOSIS — F329 Major depressive disorder, single episode, unspecified: Secondary | ICD-10-CM | POA: Diagnosis not present

## 2019-02-19 DIAGNOSIS — I2510 Atherosclerotic heart disease of native coronary artery without angina pectoris: Secondary | ICD-10-CM | POA: Diagnosis not present

## 2019-02-19 DIAGNOSIS — Z794 Long term (current) use of insulin: Secondary | ICD-10-CM | POA: Diagnosis not present

## 2019-02-19 DIAGNOSIS — E1121 Type 2 diabetes mellitus with diabetic nephropathy: Secondary | ICD-10-CM | POA: Diagnosis not present

## 2019-02-19 DIAGNOSIS — E1151 Type 2 diabetes mellitus with diabetic peripheral angiopathy without gangrene: Secondary | ICD-10-CM | POA: Diagnosis not present

## 2019-02-19 DIAGNOSIS — N183 Chronic kidney disease, stage 3 (moderate): Secondary | ICD-10-CM | POA: Diagnosis not present

## 2019-04-22 DIAGNOSIS — Z794 Long term (current) use of insulin: Secondary | ICD-10-CM | POA: Diagnosis not present

## 2019-04-22 DIAGNOSIS — E1165 Type 2 diabetes mellitus with hyperglycemia: Secondary | ICD-10-CM | POA: Diagnosis not present

## 2019-04-22 DIAGNOSIS — E1151 Type 2 diabetes mellitus with diabetic peripheral angiopathy without gangrene: Secondary | ICD-10-CM | POA: Diagnosis not present

## 2019-04-22 DIAGNOSIS — F329 Major depressive disorder, single episode, unspecified: Secondary | ICD-10-CM | POA: Diagnosis not present

## 2019-04-22 DIAGNOSIS — I129 Hypertensive chronic kidney disease with stage 1 through stage 4 chronic kidney disease, or unspecified chronic kidney disease: Secondary | ICD-10-CM | POA: Diagnosis not present

## 2019-04-22 DIAGNOSIS — N183 Chronic kidney disease, stage 3 (moderate): Secondary | ICD-10-CM | POA: Diagnosis not present

## 2019-04-22 DIAGNOSIS — E1121 Type 2 diabetes mellitus with diabetic nephropathy: Secondary | ICD-10-CM | POA: Diagnosis not present

## 2019-04-22 DIAGNOSIS — I2510 Atherosclerotic heart disease of native coronary artery without angina pectoris: Secondary | ICD-10-CM | POA: Diagnosis not present

## 2019-05-26 DIAGNOSIS — Z1389 Encounter for screening for other disorder: Secondary | ICD-10-CM | POA: Diagnosis not present

## 2019-05-26 DIAGNOSIS — Z0000 Encounter for general adult medical examination without abnormal findings: Secondary | ICD-10-CM | POA: Diagnosis not present

## 2019-06-16 DIAGNOSIS — N183 Chronic kidney disease, stage 3 (moderate): Secondary | ICD-10-CM | POA: Diagnosis not present

## 2019-06-16 DIAGNOSIS — I129 Hypertensive chronic kidney disease with stage 1 through stage 4 chronic kidney disease, or unspecified chronic kidney disease: Secondary | ICD-10-CM | POA: Diagnosis not present

## 2019-06-16 DIAGNOSIS — E1151 Type 2 diabetes mellitus with diabetic peripheral angiopathy without gangrene: Secondary | ICD-10-CM | POA: Diagnosis not present

## 2019-06-16 DIAGNOSIS — Z794 Long term (current) use of insulin: Secondary | ICD-10-CM | POA: Diagnosis not present

## 2019-06-16 DIAGNOSIS — E1121 Type 2 diabetes mellitus with diabetic nephropathy: Secondary | ICD-10-CM | POA: Diagnosis not present

## 2019-06-16 DIAGNOSIS — G4733 Obstructive sleep apnea (adult) (pediatric): Secondary | ICD-10-CM | POA: Diagnosis not present

## 2019-06-16 DIAGNOSIS — I739 Peripheral vascular disease, unspecified: Secondary | ICD-10-CM | POA: Diagnosis not present

## 2019-06-16 DIAGNOSIS — E7800 Pure hypercholesterolemia, unspecified: Secondary | ICD-10-CM | POA: Diagnosis not present

## 2019-06-25 DIAGNOSIS — E1121 Type 2 diabetes mellitus with diabetic nephropathy: Secondary | ICD-10-CM | POA: Diagnosis not present

## 2019-06-25 DIAGNOSIS — I129 Hypertensive chronic kidney disease with stage 1 through stage 4 chronic kidney disease, or unspecified chronic kidney disease: Secondary | ICD-10-CM | POA: Diagnosis not present

## 2019-06-25 DIAGNOSIS — E1165 Type 2 diabetes mellitus with hyperglycemia: Secondary | ICD-10-CM | POA: Diagnosis not present

## 2019-06-25 DIAGNOSIS — I2510 Atherosclerotic heart disease of native coronary artery without angina pectoris: Secondary | ICD-10-CM | POA: Diagnosis not present

## 2019-06-25 DIAGNOSIS — F329 Major depressive disorder, single episode, unspecified: Secondary | ICD-10-CM | POA: Diagnosis not present

## 2019-06-25 DIAGNOSIS — Z794 Long term (current) use of insulin: Secondary | ICD-10-CM | POA: Diagnosis not present

## 2019-06-25 DIAGNOSIS — E1151 Type 2 diabetes mellitus with diabetic peripheral angiopathy without gangrene: Secondary | ICD-10-CM | POA: Diagnosis not present

## 2019-06-25 DIAGNOSIS — N183 Chronic kidney disease, stage 3 (moderate): Secondary | ICD-10-CM | POA: Diagnosis not present

## 2019-07-06 DIAGNOSIS — E1151 Type 2 diabetes mellitus with diabetic peripheral angiopathy without gangrene: Secondary | ICD-10-CM | POA: Diagnosis not present

## 2019-07-06 DIAGNOSIS — F329 Major depressive disorder, single episode, unspecified: Secondary | ICD-10-CM | POA: Diagnosis not present

## 2019-07-06 DIAGNOSIS — I2510 Atherosclerotic heart disease of native coronary artery without angina pectoris: Secondary | ICD-10-CM | POA: Diagnosis not present

## 2019-07-06 DIAGNOSIS — E1165 Type 2 diabetes mellitus with hyperglycemia: Secondary | ICD-10-CM | POA: Diagnosis not present

## 2019-07-06 DIAGNOSIS — N183 Chronic kidney disease, stage 3 (moderate): Secondary | ICD-10-CM | POA: Diagnosis not present

## 2019-07-06 DIAGNOSIS — Z794 Long term (current) use of insulin: Secondary | ICD-10-CM | POA: Diagnosis not present

## 2019-07-06 DIAGNOSIS — I129 Hypertensive chronic kidney disease with stage 1 through stage 4 chronic kidney disease, or unspecified chronic kidney disease: Secondary | ICD-10-CM | POA: Diagnosis not present

## 2019-07-06 DIAGNOSIS — E1121 Type 2 diabetes mellitus with diabetic nephropathy: Secondary | ICD-10-CM | POA: Diagnosis not present

## 2019-08-18 DIAGNOSIS — Z794 Long term (current) use of insulin: Secondary | ICD-10-CM | POA: Diagnosis not present

## 2019-08-18 DIAGNOSIS — E1151 Type 2 diabetes mellitus with diabetic peripheral angiopathy without gangrene: Secondary | ICD-10-CM | POA: Diagnosis not present

## 2019-08-18 DIAGNOSIS — I129 Hypertensive chronic kidney disease with stage 1 through stage 4 chronic kidney disease, or unspecified chronic kidney disease: Secondary | ICD-10-CM | POA: Diagnosis not present

## 2019-08-18 DIAGNOSIS — I2510 Atherosclerotic heart disease of native coronary artery without angina pectoris: Secondary | ICD-10-CM | POA: Diagnosis not present

## 2019-08-18 DIAGNOSIS — N183 Chronic kidney disease, stage 3 (moderate): Secondary | ICD-10-CM | POA: Diagnosis not present

## 2019-08-18 DIAGNOSIS — E1165 Type 2 diabetes mellitus with hyperglycemia: Secondary | ICD-10-CM | POA: Diagnosis not present

## 2019-08-18 DIAGNOSIS — Z7984 Long term (current) use of oral hypoglycemic drugs: Secondary | ICD-10-CM | POA: Diagnosis not present

## 2019-08-18 DIAGNOSIS — E1121 Type 2 diabetes mellitus with diabetic nephropathy: Secondary | ICD-10-CM | POA: Diagnosis not present

## 2019-08-18 DIAGNOSIS — F329 Major depressive disorder, single episode, unspecified: Secondary | ICD-10-CM | POA: Diagnosis not present

## 2019-09-23 DIAGNOSIS — E1121 Type 2 diabetes mellitus with diabetic nephropathy: Secondary | ICD-10-CM | POA: Diagnosis not present

## 2019-09-23 DIAGNOSIS — E1165 Type 2 diabetes mellitus with hyperglycemia: Secondary | ICD-10-CM | POA: Diagnosis not present

## 2019-09-23 DIAGNOSIS — N183 Chronic kidney disease, stage 3 unspecified: Secondary | ICD-10-CM | POA: Diagnosis not present

## 2019-09-23 DIAGNOSIS — E1151 Type 2 diabetes mellitus with diabetic peripheral angiopathy without gangrene: Secondary | ICD-10-CM | POA: Diagnosis not present

## 2019-09-23 DIAGNOSIS — I2510 Atherosclerotic heart disease of native coronary artery without angina pectoris: Secondary | ICD-10-CM | POA: Diagnosis not present

## 2019-09-23 DIAGNOSIS — E7800 Pure hypercholesterolemia, unspecified: Secondary | ICD-10-CM | POA: Diagnosis not present

## 2019-09-23 DIAGNOSIS — F329 Major depressive disorder, single episode, unspecified: Secondary | ICD-10-CM | POA: Diagnosis not present

## 2019-09-23 DIAGNOSIS — I129 Hypertensive chronic kidney disease with stage 1 through stage 4 chronic kidney disease, or unspecified chronic kidney disease: Secondary | ICD-10-CM | POA: Diagnosis not present

## 2019-10-26 DIAGNOSIS — E1151 Type 2 diabetes mellitus with diabetic peripheral angiopathy without gangrene: Secondary | ICD-10-CM | POA: Diagnosis not present

## 2019-10-26 DIAGNOSIS — Z794 Long term (current) use of insulin: Secondary | ICD-10-CM | POA: Diagnosis not present

## 2019-10-26 DIAGNOSIS — I739 Peripheral vascular disease, unspecified: Secondary | ICD-10-CM | POA: Diagnosis not present

## 2019-10-26 DIAGNOSIS — N1832 Chronic kidney disease, stage 3b: Secondary | ICD-10-CM | POA: Diagnosis not present

## 2019-10-26 DIAGNOSIS — I129 Hypertensive chronic kidney disease with stage 1 through stage 4 chronic kidney disease, or unspecified chronic kidney disease: Secondary | ICD-10-CM | POA: Diagnosis not present

## 2019-10-26 DIAGNOSIS — E1121 Type 2 diabetes mellitus with diabetic nephropathy: Secondary | ICD-10-CM | POA: Diagnosis not present

## 2019-10-28 ENCOUNTER — Encounter: Payer: Self-pay | Admitting: Cardiovascular Disease

## 2019-10-28 DIAGNOSIS — E1151 Type 2 diabetes mellitus with diabetic peripheral angiopathy without gangrene: Secondary | ICD-10-CM | POA: Diagnosis not present

## 2019-10-28 DIAGNOSIS — N1832 Chronic kidney disease, stage 3b: Secondary | ICD-10-CM | POA: Diagnosis not present

## 2019-11-03 ENCOUNTER — Encounter: Payer: Self-pay | Admitting: Cardiovascular Disease

## 2019-11-03 ENCOUNTER — Other Ambulatory Visit: Payer: Self-pay

## 2019-11-03 ENCOUNTER — Ambulatory Visit (INDEPENDENT_AMBULATORY_CARE_PROVIDER_SITE_OTHER): Payer: Medicare HMO | Admitting: Cardiovascular Disease

## 2019-11-03 VITALS — BP 132/70 | HR 80 | Temp 98.1°F | Ht 69.0 in | Wt 264.0 lb

## 2019-11-03 DIAGNOSIS — E119 Type 2 diabetes mellitus without complications: Secondary | ICD-10-CM | POA: Diagnosis not present

## 2019-11-03 DIAGNOSIS — G4733 Obstructive sleep apnea (adult) (pediatric): Secondary | ICD-10-CM

## 2019-11-03 DIAGNOSIS — N183 Chronic kidney disease, stage 3 unspecified: Secondary | ICD-10-CM | POA: Diagnosis not present

## 2019-11-03 DIAGNOSIS — I10 Essential (primary) hypertension: Secondary | ICD-10-CM | POA: Diagnosis not present

## 2019-11-03 DIAGNOSIS — I2510 Atherosclerotic heart disease of native coronary artery without angina pectoris: Secondary | ICD-10-CM | POA: Diagnosis not present

## 2019-11-03 DIAGNOSIS — E785 Hyperlipidemia, unspecified: Secondary | ICD-10-CM | POA: Insufficient documentation

## 2019-11-03 DIAGNOSIS — I739 Peripheral vascular disease, unspecified: Secondary | ICD-10-CM | POA: Diagnosis not present

## 2019-11-03 DIAGNOSIS — Z794 Long term (current) use of insulin: Secondary | ICD-10-CM

## 2019-11-03 DIAGNOSIS — E782 Mixed hyperlipidemia: Secondary | ICD-10-CM

## 2019-11-03 NOTE — [place] Notes (Signed)
11/03/2019 Kyle Delgado   Mar 07, 1946  [id number]  Primary Physician Kyle Orn, MD Primary Cardiologist: Kyle Harp MD Kyle Delgado, [place]  HPI:  Kyle Delgado is a 73 y.o. severely overweight single African-American male with no children who is retired from working at [employer] .  He was referred by Kyle Delgado for second opinion for evaluation treatment of PAD.  His risk factors include remote tobacco abuse, treated hypertension, diabetes and hyperlipidemia.  Is never had a heart attack or stroke.  There is no family history of heart disease.  He denies chest pain or shortness of breath.  He does have obstructive sleep apnea intolerant to CPAP.  He had stenting of his LAD March 2012 in [place] and is asymptomatic from this.  Over the last year or 2 has had lifestyle limiting claudication with work-up performed at the [facility] thought not to be a candidate for intervention because of CKD 3.  He is referred here for second opinion.   Current Meds  Medication Sig  . aspirin 81 MG tablet Take 81 mg by mouth daily.  . insulin aspart protamine- aspart (NOVOLOG MIX 70/30) (70-30) 100 UNIT/ML injection Inject 70 Units into the skin 2 (two) times daily.  [name] lisinopril (PRINIVIL,ZESTRIL) 40 MG tablet Take 20 mg by mouth daily.   . NON FORMULARY Insulin Pen Needle 31G X 8 MM Miscellaneous as directed  . rosuvastatin (CRESTOR) 20 MG tablet Take 20 mg by mouth daily.  . sildenafil (VIAGRA) 100 MG tablet Take 100 mg by mouth daily as needed for erectile dysfunction.     No Known Allergies  Social History   Socioeconomic History  . Marital status: Single    Spouse name: Not on file  . Number of children: Not on file  . Years of education: Not on file  . Highest education level: Not on file  Occupational History  . Not on file  Social Needs  . Financial resource strain: Not on file  . Food insecurity    Worry: Not on file    Inability: Not on file  . Transportation  needs    Medical: Not on file    Non-medical: Not on file  Tobacco Use  . Smoking status: Former Smoker    Packs/day: 1.00    Types: Cigarettes    Quit date: 2008    Years since quitting: 12.9  . Smokeless tobacco: Never Used  Substance and Sexual Activity  . Alcohol use: No  . Drug use: No  . Sexual activity: Not on file  Lifestyle  . Physical activity    Days per week: Not on file    Minutes per session: Not on file  . Stress: Not on file  Relationships  . Social Herbalist on phone: Not on file    Gets together: Not on file    Attends religious service: Not on file    Active member of club or organization: Not on file    Attends meetings of clubs or organizations: Not on file    Relationship status: Not on file  . Intimate partner violence    Fear of current or ex partner: Not on file    Emotionally abused: Not on file    Physically abused: Not on file    Forced sexual activity: Not on file  Other Topics Concern  . Not on file  Social History Narrative  . Not on file     Review  of Systems: General: negative for chills, fever, night sweats or weight changes.  Cardiovascular: negative for chest pain, dyspnea on exertion, edema, orthopnea, palpitations, paroxysmal nocturnal dyspnea or shortness of breath Dermatological: negative for rash Respiratory: negative for cough or wheezing Urologic: negative for hematuria Abdominal: negative for nausea, vomiting, diarrhea, bright red blood per rectum, melena, or hematemesis Neurologic: negative for visual changes, syncope, or dizziness All other systems reviewed and are otherwise negative except as noted above.    Blood pressure 132/70, pulse 80, temperature 98.1 F (36.7 C), height 5\' 9"  (1.753 m), weight 264 lb (119.7 kg).  General appearance: alert and no distress Neck: no adenopathy, no carotid bruit, no JVD, supple, symmetrical, trachea midline and thyroid not enlarged, symmetric, no tenderness/mass/nodules  Lungs: clear to auscultation bilaterally Heart: regular rate and rhythm, S1, S2 normal, no murmur, click, rub or gallop Extremities: extremities normal, atraumatic, no cyanosis or edema Pulses: Diminished pedal pulses Skin: Skin color, texture, turgor normal. No rashes or lesions Neurologic: Alert and oriented X 3, normal strength and tone. Normal symmetric reflexes. Normal coordination and gait  EKG sinus rhythm at 80 with nonspecific ST-T wave changes and septal Q waves.  I personally reviewed this EKG.  ASSESSMENT AND PLAN:   Obstructive sleep apnea History of obstructive sleep apnea not on CPAP because he cannot tolerate it  Essential hypertension History of essential potential blood pressure measured today 132/70.  He is on Prinivil.  Hyperlipidemia History of hyperlipidemia on Crestor followed by his PCP  Coronary artery disease History of CAD status post LAD stenting March 2012 in [place].  He denies chest pain or shortness of breath.  Peripheral arterial disease [facility]) Kyle Delgado was referred by Kyle Delgado for second opinion because of PAD.  He was diagnosed at the [facility] in [place] but thought to be high risk for intervention because of CKD 3.  He has had lifestyle limiting claudication for the last several years.  Serum creatinine is in the 2 range with a GFR around 40.  Ongoing get lower extremity arterial Dopplers to further evaluate the extent of his disease prior to making decision regarding suitability for intervention.      Kyle Harp MD FACP,FACC,FAHA, [facility] 11/03/2019 1:48 PM

## 2019-11-03 NOTE — Assessment & Plan Note (Signed)
History of hyperlipidemia on Crestor followed by his PCP 

## 2019-11-03 NOTE — Assessment & Plan Note (Signed)
History of CAD status post LAD stenting March 2012 in [place].  He denies chest pain or shortness of breath.

## 2019-11-03 NOTE — Assessment & Plan Note (Signed)
History of obstructive sleep apnea not on CPAP because he cannot tolerate it. 

## 2019-11-03 NOTE — Patient Instructions (Signed)
Medication Instructions:  Your physician recommends that you continue on your current medications as directed. Please refer to the Current Medication list given to you today.  If you need a refill on your cardiac medications before your next appointment, please call your pharmacy.   Lab work: NONE  Testing/Procedures: Your physician has requested that you have a lower extremity arterial exercise duplex. During this test, exercise and ultrasound are used to evaluate arterial blood flow in the legs. Allow one hour for this exam. There are no restrictions or special instructions.  AND  Your physician has requested that you have an ankle brachial index (ABI). During this test an ultrasound and blood pressure cuff are used to evaluate the arteries that supply the arms and legs with blood. Allow thirty minutes for this exam. There are no restrictions or special instructions.  Follow-Up: At [facility], you and your health needs are our priority.  As part of our continuing mission to provide you with exceptional heart care, we have created designated Provider Care Teams.  These Care Teams include your primary Cardiologist (physician) and Advanced Practice Providers (APPs -  Physician Assistants and Nurse Practitioners) who all work together to provide you with the care you need, when you need it. You may see Dr Gwenlyn Found or one of the following Advanced Practice Providers on your designated Care Team:    Kerin Ransom, PA-C  [place], [place]  Coletta Memos, [place]  Your physician wants you to follow-up in: 2 weeks after dopplers.

## 2019-11-03 NOTE — Assessment & Plan Note (Signed)
History of essential potential blood pressure measured today 132/70.  He is on Prinivil.

## 2019-11-03 NOTE — Assessment & Plan Note (Signed)
Kyle Delgado was referred by Dr. Laurann Montana for second opinion because of PAD.  He was diagnosed at the [facility] in [place] but thought to be high risk for intervention because of CKD 3.  He has had lifestyle limiting claudication for the last several years.  Serum creatinine is in the 2 range with a GFR around 40.  Ongoing get lower extremity arterial Dopplers to further evaluate the extent of his disease prior to making decision regarding suitability for intervention.

## 2019-11-22 ENCOUNTER — Other Ambulatory Visit: Payer: Self-pay

## 2019-11-22 ENCOUNTER — Ambulatory Visit (HOSPITAL_COMMUNITY)
Admission: RE | Admit: 2019-11-22 | Discharge: 2019-11-22 | Disposition: A | Discharge status: Home / Self Care | Payer: Medicare HMO | Source: Ambulatory Visit | Attending: Cardiovascular Disease | Admitting: Cardiovascular Disease

## 2019-11-22 DIAGNOSIS — I739 Peripheral vascular disease, unspecified: Secondary | ICD-10-CM | POA: Diagnosis not present

## 2019-11-23 ENCOUNTER — Telehealth: Payer: Self-pay

## 2019-11-23 NOTE — Telephone Encounter (Signed)
-----   Message from Lorretta Harp, MD sent at 11/23/2019  7:51 AM EST ----- Return office visit with me to discuss results at next available

## 2019-11-23 NOTE — Telephone Encounter (Signed)
Tried calling pt to set up appt. Voicemail has not been set-up yet. Will send to schedulers.

## 2019-11-25 DIAGNOSIS — E1121 Type 2 diabetes mellitus with diabetic nephropathy: Secondary | ICD-10-CM | POA: Diagnosis not present

## 2019-11-25 DIAGNOSIS — E1165 Type 2 diabetes mellitus with hyperglycemia: Secondary | ICD-10-CM | POA: Diagnosis not present

## 2019-11-25 DIAGNOSIS — F329 Major depressive disorder, single episode, unspecified: Secondary | ICD-10-CM | POA: Diagnosis not present

## 2019-11-25 DIAGNOSIS — E7800 Pure hypercholesterolemia, unspecified: Secondary | ICD-10-CM | POA: Diagnosis not present

## 2019-11-25 DIAGNOSIS — E1151 Type 2 diabetes mellitus with diabetic peripheral angiopathy without gangrene: Secondary | ICD-10-CM | POA: Diagnosis not present

## 2019-11-25 DIAGNOSIS — I2510 Atherosclerotic heart disease of native coronary artery without angina pectoris: Secondary | ICD-10-CM | POA: Diagnosis not present

## 2019-11-25 DIAGNOSIS — I129 Hypertensive chronic kidney disease with stage 1 through stage 4 chronic kidney disease, or unspecified chronic kidney disease: Secondary | ICD-10-CM | POA: Diagnosis not present

## 2019-12-14 ENCOUNTER — Encounter (INDEPENDENT_AMBULATORY_CARE_PROVIDER_SITE_OTHER): Payer: Self-pay

## 2019-12-14 ENCOUNTER — Ambulatory Visit: Payer: Medicare HMO | Admitting: Cardiovascular Disease

## 2019-12-14 ENCOUNTER — Other Ambulatory Visit: Payer: Self-pay

## 2019-12-14 ENCOUNTER — Encounter: Payer: Self-pay | Admitting: Cardiovascular Disease

## 2019-12-14 VITALS — BP 130/74 | HR 76 | Temp 97.3°F | Ht 70.0 in | Wt 262.0 lb

## 2019-12-14 DIAGNOSIS — I739 Peripheral vascular disease, unspecified: Secondary | ICD-10-CM | POA: Diagnosis not present

## 2019-12-14 DIAGNOSIS — E782 Mixed hyperlipidemia: Secondary | ICD-10-CM | POA: Diagnosis not present

## 2019-12-14 MED ORDER — ROSUVASTATIN CALCIUM 40 MG PO TABS: 40.0000 mg | ORAL_TABLET | Freq: Every day | ORAL | 3 refills | Status: DC

## 2019-12-14 NOTE — Addendum Note (Signed)
Addended by: Cain Sieve on: 12/14/2019 02:05 PM   Modules accepted: Orders

## 2019-12-14 NOTE — Patient Instructions (Addendum)
Medication Instructions:  Increase Crestor to 40mg  Daily  If you need a refill on your cardiac medications before your next appointment, please call your pharmacy.   Lab work: Fasting Lipids and Hepatic Function in 2 months  Testing/Procedures: NONE  Follow-Up: At [facility], you and your health needs are our priority.  As part of our continuing mission to provide you with exceptional heart care, we have created designated Provider Care Teams.  These Care Teams include your primary Cardiologist (physician) and Advanced Practice Providers (APPs -  Physician Assistants and Nurse Practitioners) who all work together to provide you with the care you need, when you need it. You may see Dr. Gwenlyn Found or one of the following Advanced Practice Providers on your designated Care Team:    Kerin Ransom, PA-C  [place], [place]  Coletta Memos, [place]  Your physician wants you to follow-up in: 3 months with Dr. Gwenlyn Found

## 2019-12-14 NOTE — [place] Notes (Signed)
Kyle Delgado returns today for follow-up of his Doppler studies which were performed 11/22/2019.  Today revealed a right ABI of 0.75 and a left of 0.60.  He appears to have SFA and tibial vessel disease.  His serum creatinine is in the 1 4 range making angiography somewhat problematic.  Given the elective nature of his symptoms and the current current pandemic we are going to delay intervention for 3 months.  His LDL was 92 measured on 06/16/2019 on Crestor 20 mg.  Can increase this to 40 mg.  Lorretta Harp, M.D., FACP, [facility], FAHA, [place] [address]. [place], [place]  [id number]  [phone] 12/14/2019 1:55 PM

## 2019-12-22 DIAGNOSIS — E782 Mixed hyperlipidemia: Secondary | ICD-10-CM | POA: Diagnosis not present

## 2019-12-22 DIAGNOSIS — I739 Peripheral vascular disease, unspecified: Secondary | ICD-10-CM | POA: Diagnosis not present

## 2019-12-22 LAB — LIPID PANEL
Chol/HDL Ratio: 3.3 ratio (ref 0.0–5.0)
Cholesterol, Total: 108 mg/dL (ref 100–199)
HDL: 33 mg/dL — ABNORMAL LOW (ref >39)
LDL Chol Calc (NIH): 59 mg/dL (ref 0–99)
Triglycerides: 80 mg/dL (ref 0–149)
VLDL Cholesterol Cal: 16 mg/dL (ref 5–40)

## 2019-12-22 LAB — HEPATIC FUNCTION PANEL
ALT: 20 IU/L (ref 0–44)
AST: 17 IU/L (ref 0–40)
Albumin: 4.1 g/dL (ref 3.7–4.7)
Alkaline Phosphatase: 62 IU/L (ref 39–117)
Bilirubin Total: 0.5 mg/dL (ref 0.0–1.2)
Bilirubin, Direct: 0.15 mg/dL (ref 0.00–0.40)
Total Protein: 7 g/dL (ref 6.0–8.5)

## 2020-01-17 DIAGNOSIS — H5203 Hypermetropia, bilateral: Secondary | ICD-10-CM | POA: Diagnosis not present

## 2020-01-17 DIAGNOSIS — H11159 Pinguecula, unspecified eye: Secondary | ICD-10-CM | POA: Diagnosis not present

## 2020-01-17 DIAGNOSIS — Z794 Long term (current) use of insulin: Secondary | ICD-10-CM | POA: Diagnosis not present

## 2020-01-17 DIAGNOSIS — I10 Essential (primary) hypertension: Secondary | ICD-10-CM | POA: Diagnosis not present

## 2020-01-17 DIAGNOSIS — H524 Presbyopia: Secondary | ICD-10-CM | POA: Diagnosis not present

## 2020-01-17 DIAGNOSIS — H52223 Regular astigmatism, bilateral: Secondary | ICD-10-CM | POA: Diagnosis not present

## 2020-01-17 DIAGNOSIS — E119 Type 2 diabetes mellitus without complications: Secondary | ICD-10-CM | POA: Diagnosis not present

## 2020-01-17 DIAGNOSIS — H25099 Other age-related incipient cataract, unspecified eye: Secondary | ICD-10-CM | POA: Diagnosis not present

## 2020-02-18 DIAGNOSIS — I129 Hypertensive chronic kidney disease with stage 1 through stage 4 chronic kidney disease, or unspecified chronic kidney disease: Secondary | ICD-10-CM | POA: Diagnosis not present

## 2020-02-18 DIAGNOSIS — E7800 Pure hypercholesterolemia, unspecified: Secondary | ICD-10-CM | POA: Diagnosis not present

## 2020-02-18 DIAGNOSIS — N1832 Chronic kidney disease, stage 3b: Secondary | ICD-10-CM | POA: Diagnosis not present

## 2020-02-18 DIAGNOSIS — N183 Chronic kidney disease, stage 3 unspecified: Secondary | ICD-10-CM | POA: Diagnosis not present

## 2020-02-18 DIAGNOSIS — E1121 Type 2 diabetes mellitus with diabetic nephropathy: Secondary | ICD-10-CM | POA: Diagnosis not present

## 2020-02-18 DIAGNOSIS — I2510 Atherosclerotic heart disease of native coronary artery without angina pectoris: Secondary | ICD-10-CM | POA: Diagnosis not present

## 2020-02-18 DIAGNOSIS — E1165 Type 2 diabetes mellitus with hyperglycemia: Secondary | ICD-10-CM | POA: Diagnosis not present

## 2020-02-18 DIAGNOSIS — E1151 Type 2 diabetes mellitus with diabetic peripheral angiopathy without gangrene: Secondary | ICD-10-CM | POA: Diagnosis not present

## 2020-02-18 DIAGNOSIS — F329 Major depressive disorder, single episode, unspecified: Secondary | ICD-10-CM | POA: Diagnosis not present

## 2020-02-28 DIAGNOSIS — E1121 Type 2 diabetes mellitus with diabetic nephropathy: Secondary | ICD-10-CM | POA: Diagnosis not present

## 2020-02-28 DIAGNOSIS — N1831 Chronic kidney disease, stage 3a: Secondary | ICD-10-CM | POA: Diagnosis not present

## 2020-02-28 DIAGNOSIS — I739 Peripheral vascular disease, unspecified: Secondary | ICD-10-CM | POA: Diagnosis not present

## 2020-02-28 DIAGNOSIS — I129 Hypertensive chronic kidney disease with stage 1 through stage 4 chronic kidney disease, or unspecified chronic kidney disease: Secondary | ICD-10-CM | POA: Diagnosis not present

## 2020-02-28 DIAGNOSIS — Z794 Long term (current) use of insulin: Secondary | ICD-10-CM | POA: Diagnosis not present

## 2020-02-28 DIAGNOSIS — E1151 Type 2 diabetes mellitus with diabetic peripheral angiopathy without gangrene: Secondary | ICD-10-CM | POA: Diagnosis not present

## 2020-03-14 ENCOUNTER — Other Ambulatory Visit: Payer: Self-pay

## 2020-03-14 ENCOUNTER — Ambulatory Visit: Payer: Medicare PPO | Admitting: Cardiovascular Disease

## 2020-03-14 ENCOUNTER — Encounter: Payer: Self-pay | Admitting: Cardiovascular Disease

## 2020-03-14 DIAGNOSIS — I739 Peripheral vascular disease, unspecified: Secondary | ICD-10-CM | POA: Diagnosis not present

## 2020-03-14 NOTE — H&P (View-Only) (Signed)
03/14/2020 Kyle Delgado   Nov 28, 1945  [id number]  Primary Physician Kyle Orn, MD Primary Cardiologist: Kyle Harp MD Kyle Delgado, [place]  HPI:  Kyle Delgado is a 74 y.o.  severely overweight single African-American male with no children who is retired from working at [employer] .  He was referred by Kyle Delgado for second opinion for evaluation treatment of PAD.  I last saw him in the office 11/03/2019. His risk factors include remote tobacco abuse, treated hypertension, diabetes and hyperlipidemia.  Is never had a heart attack or stroke.  There is no family history of heart disease.  He denies chest pain or shortness of breath.  He does have obstructive sleep apnea intolerant to CPAP.  He had stenting of his LAD March 2012 in [place] and is asymptomatic from this.  Over the last year or 2 has had lifestyle limiting claudication with work-up performed at the [facility] thought not to be a candidate for intervention because of CKD 3.  He is referred here for second opinion.  He did have Dopplers performed in office 11/14/2019 revealing a right ABI of 0.75 and a left of 0.60.  He had a high-frequency signal in his mid right SFA with an occluded anterior tibial, moderate disease in his left SFA with occluded posterior tibial.  He wishes to proceed with angiography and potential endovascular therapy for lifestyle limiting claudication.   Current Meds  Medication Sig  . aspirin 81 MG tablet Take 81 mg by mouth daily.  . insulin aspart protamine- aspart (NOVOLOG MIX 70/30) (70-30) 100 UNIT/ML injection Inject 70 Units into the skin 2 (two) times daily.  [name] lisinopril (PRINIVIL,ZESTRIL) 40 MG tablet Take 20 mg by mouth daily.   . NON FORMULARY Insulin Pen Needle 31G X 8 MM Miscellaneous as directed  . rosuvastatin (CRESTOR) 40 MG tablet Take 1 tablet (40 mg total) by mouth daily.  . sildenafil (VIAGRA) 100 MG tablet Take 100 mg by mouth daily as needed for erectile dysfunction.      No Known Allergies  Social History   Socioeconomic History  . Marital status: Single    Spouse name: Not on file  . Number of children: Not on file  . Years of education: Not on file  . Highest education level: Not on file  Occupational History  . Not on file  Tobacco Use  . Smoking status: Former Smoker    Packs/day: 1.00    Types: Cigarettes    Quit date: 2008    Years since quitting: 13.3  . Smokeless tobacco: Never Used  Substance and Sexual Activity  . Alcohol use: No  . Drug use: No  . Sexual activity: Not on file  Other Topics Concern  . Not on file  Social History Narrative  . Not on file   Social Determinants of Health   Financial Resource Strain:   . Difficulty of Paying Living Expenses:   Food Insecurity:   . Worried About Charity fundraiser in the Last Year:   . Arboriculturist in the Last Year:   Transportation Needs:   . Film/video editor (Medical):   [name] Lack of Transportation (Non-Medical):   Physical Activity:   . Days of Exercise per Week:   . Minutes of Exercise per Session:   Stress:   . Feeling of Stress :   Social Connections:   . Frequency of Communication with Friends and Family:   . Frequency of Social  Gatherings with Friends and Family:   . Attends Religious Services:   . Active Member of Clubs or Organizations:   . Attends Archivist Meetings:   [name] Marital Status:   Intimate Partner Violence:   . Fear of Current or Ex-Partner:   . Emotionally Abused:   [name] Physically Abused:   . Sexually Abused:      Review of Systems: General: negative for chills, fever, night sweats or weight changes.  Cardiovascular: negative for chest pain, dyspnea on exertion, edema, orthopnea, palpitations, paroxysmal nocturnal dyspnea or shortness of breath Dermatological: negative for rash Respiratory: negative for cough or wheezing Urologic: negative for hematuria Abdominal: negative for nausea, vomiting, diarrhea, bright red blood per  rectum, melena, or hematemesis Neurologic: negative for visual changes, syncope, or dizziness All other systems reviewed and are otherwise negative except as noted above.    Blood pressure (!) 142/76, pulse 78, height 5\' 9"  (1.753 m), weight 260 lb 6.4 oz (118.1 kg), SpO2 93 %.  General appearance: alert and no distress Neck: no adenopathy, no carotid bruit, no JVD, supple, symmetrical, trachea midline and thyroid not enlarged, symmetric, no tenderness/mass/nodules Lungs: clear to auscultation bilaterally Heart: regular rate and rhythm, S1, S2 normal, no murmur, click, rub or gallop Extremities: extremities normal, atraumatic, no cyanosis or edema Pulses: Diminished pedal pulses bilaterally Skin: Skin color, texture, turgor normal. No rashes or lesions Neurologic: Alert and oriented X 3, normal strength and tone. Normal symmetric reflexes. Normal coordination and gait  EKG not performed today  ASSESSMENT AND PLAN:   Peripheral arterial disease [facility]) Kyle Delgado returns today for follow-up of his Doppler studies which were performed 11/22/2019.  His right ABI was 0.75 and left 0.60.  He did have a high-frequency signal in his mid right SFA with an occluded anterior tibial and moderate disease in his left SFA with occluded posterior tibial.  His last serum creatinine was 1.6 on 10/28/2019.  That he does have moderate renal insufficiency and is diabetic although I think his creatinine clearance will allow [place] to perform perform diagnostic angiography plus or minus intervention.  We will hold his lisinopril for 3 days prior to the procedure and admit him early for hydration.      Kyle Harp MD FACP,FACC,FAHA, [facility] 03/14/2020 1:38 PM

## 2020-03-14 NOTE — Progress Notes (Signed)
03/14/2020 Kyle Delgado   September 21, 1946  ID:[id number]  Primary Physician Kyle Orn, MD Primary Cardiologist: Kyle Harp MD Kyle Delgado, [place]  HPI:  Kyle Delgado is a 74 y.o.  severely overweight single African-American male with no children who is retired from working at [employer] .  He was referred by Dr. Laurann Delgado for second opinion for evaluation treatment of PAD.  I last saw him in the office 11/03/2019. His risk factors include remote tobacco abuse, treated hypertension, diabetes and hyperlipidemia.  Is never had a heart attack or stroke.  There is no family history of heart disease.  He denies chest pain or shortness of breath.  He does have obstructive sleep apnea intolerant to CPAP.  He had stenting of his LAD March 2012 in [place] and is asymptomatic from this.  Over the last year or 2 has had lifestyle limiting claudication with work-up performed at the [facility] thought not to be a candidate for intervention because of CKD 3.  He is referred here for second opinion.  He did have Dopplers performed in office 11/14/2019 revealing a right ABI of 0.75 and a left of 0.60.  He had a high-frequency signal in his mid right SFA with an occluded anterior tibial, moderate disease in his left SFA with occluded posterior tibial.  He wishes to proceed with angiography and potential endovascular therapy for lifestyle limiting claudication.   Current Meds  Medication Sig  . aspirin 81 MG tablet Take 81 mg by mouth daily.  . insulin aspart protamine- aspart (NOVOLOG MIX 70/30) (70-30) 100 UNIT/ML injection Inject 70 Units into the skin 2 (two) times daily.  [name] lisinopril (PRINIVIL,ZESTRIL) 40 MG tablet Take 20 mg by mouth daily.   . NON FORMULARY Insulin Pen Needle 31G X 8 MM Miscellaneous as directed  . rosuvastatin (CRESTOR) 40 MG tablet Take 1 tablet (40 mg total) by mouth daily.  . sildenafil (VIAGRA) 100 MG tablet Take 100 mg by mouth daily as needed for erectile dysfunction.      No Known Allergies  Social History   Socioeconomic History  . Marital status: Single    Spouse name: Not on file  . Number of children: Not on file  . Years of education: Not on file  . Highest education level: Not on file  Occupational History  . Not on file  Tobacco Use  . Smoking status: Former Smoker    Packs/day: 1.00    Types: Cigarettes    Quit date: 2008    Years since quitting: 13.3  . Smokeless tobacco: Never Used  Substance and Sexual Activity  . Alcohol use: No  . Drug use: No  . Sexual activity: Not on file  Other Topics Concern  . Not on file  Social History Narrative  . Not on file   Social Determinants of Health   Financial Resource Strain:   . Difficulty of Paying Living Expenses:   Food Insecurity:   . Worried About Charity fundraiser in the Last Year:   . Arboriculturist in the Last Year:   Transportation Needs:   . Film/video editor (Medical):   [name] Lack of Transportation (Non-Medical):   Physical Activity:   . Days of Exercise per Week:   . Minutes of Exercise per Session:   Stress:   . Feeling of Stress :   Social Connections:   . Frequency of Communication with Friends and Family:   . Frequency of Social  Gatherings with Friends and Family:   . Attends Religious Services:   . Active Member of Clubs or Organizations:   . Attends Archivist Meetings:   [name] Marital Status:   Intimate Partner Violence:   . Fear of Current or Ex-Partner:   . Emotionally Abused:   [name] Physically Abused:   . Sexually Abused:      Review of Systems: General: negative for chills, fever, night sweats or weight changes.  Cardiovascular: negative for chest pain, dyspnea on exertion, edema, orthopnea, palpitations, paroxysmal nocturnal dyspnea or shortness of breath Dermatological: negative for rash Respiratory: negative for cough or wheezing Urologic: negative for hematuria Abdominal: negative for nausea, vomiting, diarrhea, bright red blood  per rectum, melena, or hematemesis Neurologic: negative for visual changes, syncope, or dizziness All other systems reviewed and are otherwise negative except as noted above.    Blood pressure (!) 142/76, pulse 78, height 5\' 9"  (1.753 m), weight 260 lb 6.4 oz (118.1 kg), SpO2 93 %.  General appearance: alert and no distress Neck: no adenopathy, no carotid bruit, no JVD, supple, symmetrical, trachea midline and thyroid not enlarged, symmetric, no tenderness/mass/nodules Lungs: clear to auscultation bilaterally Heart: regular rate and rhythm, S1, S2 normal, no murmur, click, rub or gallop Extremities: extremities normal, atraumatic, no cyanosis or edema Pulses: Diminished pedal pulses bilaterally Skin: Skin color, texture, turgor normal. No rashes or lesions Neurologic: Alert and oriented X 3, normal strength and tone. Normal symmetric reflexes. Normal coordination and gait  EKG not performed today  ASSESSMENT AND PLAN:   Peripheral arterial disease [facility]) Kyle Delgado returns today for follow-up of his Doppler studies which were performed 11/22/2019.  His right ABI was 0.75 and left 0.60.  He did have a high-frequency signal in his mid right SFA with an occluded anterior tibial and moderate disease in his left SFA with occluded posterior tibial.  His last serum creatinine was 1.6 on 10/28/2019.  That he does have moderate renal insufficiency and is diabetic although I think his creatinine clearance will allow [place] to perform perform diagnostic angiography plus or minus intervention.  We will hold his lisinopril for 3 days prior to the procedure and admit him early for hydration.      Kyle Harp MD FACP,FACC,FAHA, [facility] 03/14/2020 1:38 PM

## 2020-03-14 NOTE — Assessment & Plan Note (Signed)
Kyle Delgado returns today for follow-up of his Doppler studies which were performed 11/22/2019.  His right ABI was 0.75 and left 0.60.  He did have a high-frequency signal in his mid right SFA with an occluded anterior tibial and moderate disease in his left SFA with occluded posterior tibial.  His last serum creatinine was 1.6 on 10/28/2019.  That he does have moderate renal insufficiency and is diabetic although I think his creatinine clearance will allow [place] to perform perform diagnostic angiography plus or minus intervention.  We will hold his lisinopril for 3 days prior to the procedure and admit him early for hydration.

## 2020-03-14 NOTE — Patient Instructions (Addendum)
    [facility] [address] [place] [address] Dept: [phone] Loc: [phone]  Kyle Delgado  03/14/2020  You are scheduled for a Peripheral Angiogram on Monday, April 26 with Dr. Quay Burow.  1. Please arrive at the [facility] (Main Entrance A) at [facility]: [address] [place], [place] [address] at 7:30 AM (This time is two hours before your procedure to ensure your preparation). Free valet parking service is available.   Special note: Every effort is made to have your procedure done on time. Please understand that emergencies sometimes delay scheduled procedures.  2. Diet: Do not eat solid foods after midnight.  The patient may have clear liquids until 5am upon the day of the procedure.  3. Labs: You will need to have blood drawn today in office COVID TEST Thursday at 12 PM  4. Medication instructions in preparation for your procedure:   Contrast Allergy: No   Hold Lisnopril 3 days prior to procedure (Do not take on Friday, Saturday, or Sunday)  Take only 35 units of insulin the night before your procedure. Do not take any insulin on the day of the procedure.   On the morning of your procedure, take your Aspirin and any morning medicines NOT listed above.  You may use sips of water.  5. Plan for one night stay--bring personal belongings. 6. Bring a current list of your medications and current insurance cards. 7. You MUST have a responsible person to drive you home. 8. Someone MUST be with you the first 24 hours after you arrive home or your discharge will be delayed. 9. Please wear clothes that are easy to get on and off and wear slip-on shoes.  Thank you for allowing [place] to care for you!   --  Invasive Cardiovascular services     Your physician has requested that you have a lower extremity arterial duplex 1 week after procedure. This test is an  ultrasound of the arteries in the legs. It looks at arterial blood flow in the legs. Allow one hour for Lower Arterial scans. There are no restrictions or special instructions  Dr. Gwenlyn Found 2 weeks after procedure

## 2020-03-15 LAB — CBC
Hematocrit: 51.9 % — ABNORMAL HIGH (ref 37.5–51.0)
Hemoglobin: 16.2 g/dL (ref 13.0–17.7)
MCH: 26.1 pg — ABNORMAL LOW (ref 26.6–33.0)
MCHC: 31.2 g/dL — ABNORMAL LOW (ref 31.5–35.7)
MCV: 84 fL (ref 79–97)
Platelets: 347 10*3/uL (ref 150–450)
RBC: 6.2 x10E6/uL — ABNORMAL HIGH (ref 4.14–5.80)
RDW: 13.3 % (ref 11.6–15.4)
WBC: 5.5 10*3/uL (ref 3.4–10.8)

## 2020-03-15 LAB — BASIC METABOLIC PANEL
BUN/Creatinine Ratio: 18 (ref 10–24)
BUN: 26 mg/dL (ref 8–27)
CO2: 21 mmol/L (ref 20–29)
Calcium: 9.3 mg/dL (ref 8.6–10.2)
Chloride: 104 mmol/L (ref 96–106)
Creatinine, Ser: 1.46 mg/dL — ABNORMAL HIGH (ref 0.76–1.27)
GFR calc Af Amer: 54 mL/min/{1.73_m2} — ABNORMAL LOW (ref >59)
GFR calc non Af Amer: 47 mL/min/{1.73_m2} — ABNORMAL LOW (ref >59)
Glucose: 123 mg/dL — ABNORMAL HIGH (ref 65–99)
Potassium: 4.8 mmol/L (ref 3.5–5.2)
Sodium: 140 mmol/L (ref 134–144)

## 2020-03-16 ENCOUNTER — Other Ambulatory Visit (HOSPITAL_COMMUNITY)
Admission: RE | Admit: 2020-03-16 | Discharge: 2020-03-16 | Disposition: A | Discharge status: Home / Self Care | Payer: Medicare PPO | Source: Ambulatory Visit | Attending: Cardiovascular Disease | Admitting: Cardiovascular Disease

## 2020-03-16 DIAGNOSIS — Z20822 Contact with and (suspected) exposure to covid-19: Secondary | ICD-10-CM | POA: Insufficient documentation

## 2020-03-16 DIAGNOSIS — Z01812 Encounter for preprocedural laboratory examination: Secondary | ICD-10-CM | POA: Diagnosis not present

## 2020-03-16 LAB — SARS CORONAVIRUS 2 (TAT 6-24 HRS): SARS Coronavirus 2: NEGATIVE

## 2020-03-17 ENCOUNTER — Other Ambulatory Visit: Payer: Self-pay | Admitting: *Deleted

## 2020-03-17 DIAGNOSIS — I739 Peripheral vascular disease, unspecified: Secondary | ICD-10-CM

## 2020-03-17 MED ORDER — SODIUM CHLORIDE 0.9% FLUSH: 3.0000 mL | Freq: Two times a day (BID) | INTRAVENOUS | Status: DC

## 2020-03-20 ENCOUNTER — Ambulatory Visit (HOSPITAL_COMMUNITY)
Admission: RE | Admit: 2020-03-20 | Discharge: 2020-03-20 | Disposition: A | Discharge status: Home / Self Care | Payer: Medicare PPO | Attending: Cardiovascular Disease | Admitting: Cardiovascular Disease

## 2020-03-20 ENCOUNTER — Encounter (HOSPITAL_COMMUNITY)
Admission: RE | Disposition: A | Discharge status: Home / Self Care | Payer: Medicare PPO | Source: Home / Self Care | Attending: Cardiovascular Disease

## 2020-03-20 ENCOUNTER — Other Ambulatory Visit: Payer: Self-pay

## 2020-03-20 DIAGNOSIS — Z794 Long term (current) use of insulin: Secondary | ICD-10-CM | POA: Insufficient documentation

## 2020-03-20 DIAGNOSIS — I739 Peripheral vascular disease, unspecified: Secondary | ICD-10-CM | POA: Diagnosis present

## 2020-03-20 DIAGNOSIS — E1151 Type 2 diabetes mellitus with diabetic peripheral angiopathy without gangrene: Secondary | ICD-10-CM | POA: Diagnosis not present

## 2020-03-20 DIAGNOSIS — Z87891 Personal history of nicotine dependence: Secondary | ICD-10-CM | POA: Insufficient documentation

## 2020-03-20 DIAGNOSIS — N183 Chronic kidney disease, stage 3 unspecified: Secondary | ICD-10-CM | POA: Diagnosis not present

## 2020-03-20 DIAGNOSIS — I70211 Atherosclerosis of native arteries of extremities with intermittent claudication, right leg: Secondary | ICD-10-CM | POA: Insufficient documentation

## 2020-03-20 DIAGNOSIS — E1122 Type 2 diabetes mellitus with diabetic chronic kidney disease: Secondary | ICD-10-CM | POA: Insufficient documentation

## 2020-03-20 DIAGNOSIS — Z7982 Long term (current) use of aspirin: Secondary | ICD-10-CM | POA: Diagnosis not present

## 2020-03-20 DIAGNOSIS — E663 Overweight: Secondary | ICD-10-CM | POA: Diagnosis not present

## 2020-03-20 DIAGNOSIS — Z6838 Body mass index (BMI) 38.0-38.9, adult: Secondary | ICD-10-CM | POA: Diagnosis not present

## 2020-03-20 DIAGNOSIS — I129 Hypertensive chronic kidney disease with stage 1 through stage 4 chronic kidney disease, or unspecified chronic kidney disease: Secondary | ICD-10-CM | POA: Diagnosis not present

## 2020-03-20 DIAGNOSIS — Z955 Presence of coronary angioplasty implant and graft: Secondary | ICD-10-CM | POA: Insufficient documentation

## 2020-03-20 DIAGNOSIS — G4733 Obstructive sleep apnea (adult) (pediatric): Secondary | ICD-10-CM | POA: Insufficient documentation

## 2020-03-20 DIAGNOSIS — Z79899 Other long term (current) drug therapy: Secondary | ICD-10-CM | POA: Insufficient documentation

## 2020-03-20 HISTORY — PX: ABDOMINAL AORTOGRAM W/LOWER EXTREMITY: CATH118223

## 2020-03-20 HISTORY — PX: PERIPHERAL VASCULAR INTERVENTION: CATH118257

## 2020-03-20 LAB — POCT ACTIVATED CLOTTING TIME
Activated Clotting Time: 241 seconds
Activated Clotting Time: 290 seconds
Activated Clotting Time: 285 seconds

## 2020-03-20 LAB — GLUCOSE, CAPILLARY
Glucose-Capillary: 187 mg/dL — ABNORMAL HIGH (ref 70–99)
Glucose-Capillary: 152 mg/dL — ABNORMAL HIGH (ref 70–99)

## 2020-03-20 SURGERY — ABDOMINAL AORTOGRAM W/LOWER EXTREMITY
Anesthesia: LOCAL | Laterality: Right

## 2020-03-20 MED ORDER — HYDRALAZINE HCL 20 MG/ML IJ SOLN
INTRAMUSCULAR | Status: DC | PRN
  Administered 2020-03-20: 10 mg via INTRAVENOUS

## 2020-03-20 MED ORDER — HEPARIN (PORCINE) IN NACL 1000-0.9 UT/500ML-% IV SOLN
INTRAVENOUS | Status: DC | PRN
  Administered 2020-03-20 (×2): 500 mL

## 2020-03-20 MED ORDER — ROSUVASTATIN CALCIUM 20 MG PO TABS: 40.0000 mg | ORAL_TABLET | Freq: Every day | ORAL | Status: DC

## 2020-03-20 MED ORDER — LISINOPRIL 40 MG PO TABS: 40.0000 mg | ORAL_TABLET | Freq: Every day | ORAL | Status: DC

## 2020-03-20 MED ORDER — LIDOCAINE HCL (PF) 1 % IJ SOLN
INTRAMUSCULAR | Status: DC | PRN
  Administered 2020-03-20: 25 mL via INTRADERMAL

## 2020-03-20 MED ORDER — HEPARIN SODIUM (PORCINE) 1000 UNIT/ML IJ SOLN
INTRAMUSCULAR | Status: DC | PRN
  Administered 2020-03-20: 2500 [IU] via INTRAVENOUS
  Administered 2020-03-20: 13000 [IU] via INTRAVENOUS
  Administered 2020-03-20: 2500 [IU] via INTRAVENOUS

## 2020-03-20 MED ORDER — HEPARIN (PORCINE) IN NACL 1000-0.9 UT/500ML-% IV SOLN
INTRAVENOUS | Status: AC
  Filled 2020-03-20: qty 500

## 2020-03-20 MED ORDER — CLOPIDOGREL BISULFATE 75 MG PO TABS
75.0000 mg | ORAL_TABLET | Freq: Every day | ORAL | 2 refills | Status: AC
Start: 2020-03-20 — End: 2020-06-18

## 2020-03-20 MED ORDER — LABETALOL HCL 5 MG/ML IV SOLN
INTRAVENOUS | Status: DC | PRN
  Administered 2020-03-20: 10 mg via INTRAVENOUS

## 2020-03-20 MED ORDER — SODIUM CHLORIDE 0.9 % IV SOLN: 250.0000 mL | INTRAVENOUS | Status: DC | PRN

## 2020-03-20 MED ORDER — LABETALOL HCL 5 MG/ML IV SOLN
INTRAVENOUS | Status: AC
  Filled 2020-03-20: qty 4

## 2020-03-20 MED ORDER — ASPIRIN 81 MG PO CHEW: 81.0000 mg | CHEWABLE_TABLET | ORAL | Status: DC

## 2020-03-20 MED ORDER — HEPARIN SODIUM (PORCINE) 1000 UNIT/ML IJ SOLN
INTRAMUSCULAR | Status: AC
  Filled 2020-03-20: qty 1

## 2020-03-20 MED ORDER — VIPERSLIDE LUBRICANT OPTIME: TOPICAL | Status: DC | PRN

## 2020-03-20 MED ORDER — MIDAZOLAM HCL 2 MG/2ML IJ SOLN
INTRAMUSCULAR | Status: DC | PRN
  Administered 2020-03-20: 1 mg via INTRAVENOUS

## 2020-03-20 MED ORDER — SODIUM CHLORIDE 0.9% FLUSH: 3.0000 mL | INTRAVENOUS | Status: DC | PRN

## 2020-03-20 MED ORDER — HYDRALAZINE HCL 20 MG/ML IJ SOLN
5.0000 mg | INTRAMUSCULAR | Status: AC | PRN
  Administered 2020-03-20 (×2): 5 mg via INTRAVENOUS

## 2020-03-20 MED ORDER — MIDAZOLAM HCL 2 MG/2ML IJ SOLN
INTRAMUSCULAR | Status: AC
  Filled 2020-03-20: qty 2

## 2020-03-20 MED ORDER — LABETALOL HCL 5 MG/ML IV SOLN
10.0000 mg | INTRAVENOUS | Status: DC | PRN
  Administered 2020-03-20 (×2): 10 mg via INTRAVENOUS

## 2020-03-20 MED ORDER — LIDOCAINE HCL (PF) 1 % IJ SOLN
INTRAMUSCULAR | Status: AC
  Filled 2020-03-20: qty 30

## 2020-03-20 MED ORDER — SODIUM CHLORIDE 0.9 % WEIGHT BASED INFUSION
3.0000 mL/kg/h | INTRAVENOUS | Status: AC
  Administered 2020-03-20: 3 mL/kg/h via INTRAVENOUS

## 2020-03-20 MED ORDER — ONDANSETRON HCL 4 MG/2ML IJ SOLN: 4.0000 mg | Freq: Four times a day (QID) | INTRAMUSCULAR | Status: DC | PRN

## 2020-03-20 MED ORDER — SODIUM CHLORIDE 0.9% FLUSH: 3.0000 mL | Freq: Two times a day (BID) | INTRAVENOUS | Status: DC

## 2020-03-20 MED ORDER — HYDRALAZINE HCL 20 MG/ML IJ SOLN
INTRAMUSCULAR | Status: AC
  Filled 2020-03-20: qty 1

## 2020-03-20 MED ORDER — CLOPIDOGREL BISULFATE 300 MG PO TABS
ORAL_TABLET | ORAL | Status: AC
  Filled 2020-03-20: qty 1

## 2020-03-20 MED ORDER — SODIUM CHLORIDE 0.9 % WEIGHT BASED INFUSION: 1.0000 mL/kg/h | INTRAVENOUS | Status: DC

## 2020-03-20 MED ORDER — SODIUM CHLORIDE 0.9 % IV SOLN: INTRAVENOUS | Status: DC

## 2020-03-20 MED ORDER — CLOPIDOGREL BISULFATE 300 MG PO TABS
ORAL_TABLET | ORAL | Status: DC | PRN
  Administered 2020-03-20: 300 mg via ORAL

## 2020-03-20 MED ORDER — MORPHINE SULFATE (PF) 2 MG/ML IV SOLN
2.0000 mg | INTRAVENOUS | Status: DC | PRN
Start: 2020-03-20 — End: 2020-03-20

## 2020-03-20 MED ORDER — ATORVASTATIN CALCIUM 40 MG PO TABS: 40.0000 mg | ORAL_TABLET | Freq: Every day | ORAL | Status: DC

## 2020-03-20 MED ORDER — FENTANYL CITRATE (PF) 100 MCG/2ML IJ SOLN
INTRAMUSCULAR | Status: AC
  Filled 2020-03-20: qty 2

## 2020-03-20 MED ORDER — NITROGLYCERIN IN D5W 200-5 MCG/ML-% IV SOLN
INTRAVENOUS | Status: AC
  Filled 2020-03-20: qty 250

## 2020-03-20 MED ORDER — ACETAMINOPHEN 325 MG PO TABS: 650.0000 mg | ORAL_TABLET | ORAL | Status: DC | PRN

## 2020-03-20 MED ORDER — FENTANYL CITRATE (PF) 100 MCG/2ML IJ SOLN
INTRAMUSCULAR | Status: DC | PRN
  Administered 2020-03-20: 25 ug via INTRAVENOUS

## 2020-03-20 MED ORDER — ASPIRIN 81 MG PO TABS: 81.0000 mg | ORAL_TABLET | Freq: Every day | ORAL | Status: DC

## 2020-03-20 MED ORDER — VERAPAMIL HCL 2.5 MG/ML IV SOLN
INTRAVENOUS | Status: AC
  Filled 2020-03-20: qty 2

## 2020-03-20 MED ORDER — CLOPIDOGREL BISULFATE 75 MG PO TABS
75.0000 mg | ORAL_TABLET | Freq: Every day | ORAL | Status: DC
Start: 2020-03-21 — End: 2020-03-20

## 2020-03-20 MED ORDER — ASPIRIN EC 81 MG PO TBEC: 81.0000 mg | DELAYED_RELEASE_TABLET | Freq: Every day | ORAL | Status: DC

## 2020-03-20 MED FILL — CLOPIDOGREL 75 MG TABLET: 75 | 30 days supply | Qty: 30 | Fill #0

## 2020-03-20 SURGICAL SUPPLY — 35 items
BAG SNAP BAND KOVER 36X36 (MISCELLANEOUS) ×1 IMPLANT
BALLN IN.PACT DCB 5X120 (BALLOONS) ×3
BALLN IN.PACT DCB 5X80 (BALLOONS) ×3
CATH ANGIO 5F PIGTAIL 65CM (CATHETERS) ×1 IMPLANT
CATH CROSS OVER TEMPO 5F (CATHETERS) ×1 IMPLANT
CATH CXI 2.3F 135 ANG 2 (CATHETERS) ×1 IMPLANT
CATH HAWKONE LS STANDARD TIP (CATHETERS) ×3
CATH HAWKONE LS STD TIP (CATHETERS) IMPLANT
CATH QUICKCROSS .018X135CM (MICROCATHETER) ×1 IMPLANT
CATH STRAIGHT 5FR 65CM (CATHETERS) ×1 IMPLANT
CLOSURE MYNX CONTROL 6F/7F (Vascular Products) ×1 IMPLANT
DCB IN.PACT 5X120 (BALLOONS) IMPLANT
DCB IN.PACT 5X80 (BALLOONS) IMPLANT
DEVICE EMBOSHIELD NAV6 4.0-7.0 (FILTER) ×1 IMPLANT
DEVICE SPIDERFX EMB PROT 6MM (WIRE) ×1 IMPLANT
DIAMONDBACK SOLID OAS 2.0MM (CATHETERS) ×3
GUIDEWIRE ZILIENT 6G 014 (WIRE) ×1 IMPLANT
KIT ENCORE 26 ADVANTAGE (KITS) ×1 IMPLANT
KIT PV (KITS) ×3 IMPLANT
LUBRICANT VIPERSLIDE CORONARY (MISCELLANEOUS) ×1 IMPLANT
SHEATH HIGHFLEX ANSEL 7FR 55CM (SHEATH) ×1 IMPLANT
SHEATH PINNACLE 5F 10CM (SHEATH) ×1 IMPLANT
SHEATH PINNACLE 7F 10CM (SHEATH) ×1 IMPLANT
SHEATH PROBE COVER 6X72 (BAG) ×1 IMPLANT
STOPCOCK MORSE 400PSI 3WAY (MISCELLANEOUS) ×1 IMPLANT
SYR MEDRAD MARK 7 150ML (SYRINGE) ×3 IMPLANT
SYSTEM DIMNDBCK SLD OAS 2.0MM (CATHETERS) IMPLANT
TAPE VIPERTRACK RADIOPAQ (MISCELLANEOUS) IMPLANT
TAPE VIPERTRACK RADIOPAQUE (MISCELLANEOUS) ×3
TRANSDUCER W/STOPCOCK (MISCELLANEOUS) ×3 IMPLANT
TRAY PV CATH (CUSTOM PROCEDURE TRAY) ×3 IMPLANT
TUBING CIL FLEX 10 FLL-RA (TUBING) ×1 IMPLANT
WIRE HITORQ VERSACORE ST 145CM (WIRE) ×1 IMPLANT
WIRE ROSEN-J .035X180CM (WIRE) ×1 IMPLANT
WIRE VIPER ADVANCE .017X335CM (WIRE) ×1 IMPLANT

## 2020-03-20 NOTE — Interval H&P Note (Signed)
History and Physical Interval Note:  03/20/2020 10:13 AM  Kyle Delgado  has presented today for surgery, with the diagnosis of pad.  The various methods of treatment have been discussed with the patient and family. After consideration of risks, benefits and other options for treatment, the patient has consented to  Procedure(s): ABDOMINAL AORTOGRAM W/LOWER EXTREMITY (N/A) as a surgical intervention.  The patient's history has been reviewed, patient examined, no change in status, stable for surgery.  I have reviewed the patient's chart and labs.  Questions were answered to the patient's satisfaction.     Quay Burow

## 2020-03-20 NOTE — Progress Notes (Signed)
Per Dr Gwenlyn Found, pt may be DC now. TOC was called for plavix dose to go home with.

## 2020-03-20 NOTE — Discharge Instructions (Signed)
Angiogram, Care After This sheet gives you information about how to care for yourself after your procedure. Your health care provider may also give you more specific instructions. If you have problems or questions, contact your health care provider. What can I expect after the procedure? After the procedure, it is common to have bruising and tenderness at the catheter insertion area. Follow these instructions at home: Insertion site care  Follow instructions from your health care provider about how to take care of your insertion site. Make sure you: ? Wash your hands with soap and water before you change your bandage (dressing). If soap and water are not available, use hand sanitizer. ? Change your dressing as told by your health care provider. ? Leave stitches (sutures), skin glue, or adhesive strips in place. These skin closures may need to stay in place for 2 weeks or longer. If adhesive strip edges start to loosen and curl up, you may trim the loose edges. Do not remove adhesive strips completely unless your health care provider tells you to do that.  Do not take baths, swim, or use a hot tub until your health care provider approves.  You may shower 24-48 hours after the procedure or as told by your health care provider. ? Gently wash the site with plain soap and water. ? Pat the area dry with a clean towel. ? Do not rub the site. This may cause bleeding.  Do not apply powder or lotion to the site. Keep the site clean and dry.  Check your insertion site every day for signs of infection. Check for: ? Redness, swelling, or pain. ? Fluid or blood. ? Warmth. ? Pus or a bad smell. Activity  Rest as told by your health care provider, usually for 1-2 days.  Do not lift anything that is heavier than 10 lbs. (4.5 kg) or as told by your health care provider.  Do not drive for 24 hours if you were given a medicine to help you relax (sedative).  Do not drive or use heavy machinery while  taking prescription pain medicine. General instructions   Return to your normal activities as told by your health care provider, usually in about a week. Ask your health care provider what activities are safe for you.  If the catheter site starts bleeding, lie flat and put pressure on the site. If the bleeding does not stop, get help right away. This is a medical emergency.  Drink enough fluid to keep your urine clear or pale yellow. This helps flush the contrast dye from your body.  Take over-the-counter and prescription medicines only as told by your health care provider.  Keep all follow-up visits as told by your health care provider. This is important. Contact a health care provider if:  You have a fever or chills.  You have redness, swelling, or pain around your insertion site.  You have fluid or blood coming from your insertion site.  The insertion site feels warm to the touch.  You have pus or a bad smell coming from your insertion site.  You have bruising around the insertion site.  You notice blood collecting in the tissue around the catheter site (hematoma). The hematoma may be painful to the touch. Get help right away if:  You have severe pain at the catheter insertion area.  The catheter insertion area swells very fast.  The catheter insertion area is bleeding, and the bleeding does not stop when you hold steady pressure on the area.    The area near or just beyond the catheter insertion site becomes pale, cool, tingly, or numb. These symptoms may represent a serious problem that is an emergency. Do not wait to see if the symptoms will go away. Get medical help right away. Call your local emergency services (911 in the [place] Do not drive yourself to the hospital. Summary  After the procedure, it is common to have bruising and tenderness at the catheter insertion area.  After the procedure, it is important to rest and drink plenty of fluids.  Do not take baths,  swim, or use a hot tub until your health care provider says it is okay to do so. You may shower 24-48 hours after the procedure or as told by your health care provider.  If the catheter site starts bleeding, lie flat and put pressure on the site. If the bleeding does not stop, get help right away. This is a medical emergency. This information is not intended to replace advice given to you by your health care provider. Make sure you discuss any questions you have with your health care provider. Document Revised: 10/24/2017 Document Reviewed: 10/16/2016 Elsevier Patient Education  2020 Elsevier Inc.  Clopidogrel tablets What is this medicine? CLOPIDOGREL (kloh PID oh grel) helps to prevent blood clots. This medicine is used to prevent heart attack, stroke, or other vascular events in people who are at high risk. This medicine may be used for other purposes; ask your health care provider or pharmacist if you have questions. COMMON BRAND NAME(S): Plavix What should I tell my health care provider before I take this medicine? They need to know if you have any of the following conditions:  bleeding disorders  bleeding in the brain  having surgery  history of stomach bleeding  an unusual or allergic reaction to clopidogrel, other medicines, foods, dyes, or preservatives  pregnant or trying to get pregnant  breast-feeding How should I use this medicine? Take this medicine by mouth with a glass of water. Follow the directions on the prescription label. You may take this medicine with or without food. If it upsets your stomach, take it with food. Take your medicine at regular intervals. Do not take it more often than directed. Do not stop taking except on your doctor's advice. A special MedGuide will be given to you by the pharmacist with each prescription and refill. Be sure to read this information carefully each time. Talk to your pediatrician regarding the use of this medicine in children.  Special care may be needed. Overdosage: If you think you have taken too much of this medicine contact a poison control center or emergency room at once. NOTE: This medicine is only for you. Do not share this medicine with others. What if I miss a dose? If you miss a dose, take it as soon as you can. If it is almost time for your next dose, take only that dose. Do not take double or extra doses. What may interact with this medicine? Do not take this medicine with the following medications:  dasabuvir; ombitasvir; paritaprevir; ritonavir  defibrotide  selexipag This medicine may also interact with the following medications:  certain medicines that treat or prevent blood clots like warfarin  narcotic medicines for pain  NSAIDs, medicines for pain and inflammation, like ibuprofen or naproxen  repaglinide  SNRIs, medicines for depression, like desvenlafaxine, duloxetine, levomilnacipran, venlafaxine  SSRIs, medicines for depression, like citalopram, escitalopram, fluoxetine, fluvoxamine, paroxetine, sertraline  stomach acid blockers like cimetidine, esomeprazole, omeprazole   This list may not describe all possible interactions. Give your health care provider a list of all the medicines, herbs, non-prescription drugs, or dietary supplements you use. Also tell them if you smoke, drink alcohol, or use illegal drugs. Some items may interact with your medicine. What should I watch for while using this medicine? Visit your doctor or health care professional for regular check-ups. Do not stop taking your medicine unless your doctor tells you to. Notify your doctor or health care professional and seek emergency treatment if you develop breathing problems; changes in vision; chest pain; severe, sudden headache; pain, swelling, warmth in the leg; trouble speaking; sudden numbness or weakness of the face, arm or leg. These can be signs that your condition has gotten worse. If you are going to have  surgery or dental work, tell your doctor or health care professional that you are taking this medicine. Certain genetic factors may reduce the effect of this medicine. Your doctor may use genetic tests to determine treatment. Only take aspirin if you are instructed to. Low doses of aspirin are used with this medicine to treat some conditions. Taking aspirin with this medicine can increase your risk of bleeding so you must be careful. Talk to your doctor or pharmacist if you have questions. What side effects may I notice from receiving this medicine? Side effects that you should report to your doctor or health care professional as soon as possible:  allergic reactions like skin rash, itching or hives, swelling of the face, lips, or tongue  signs and symptoms of bleeding such as bloody or black, tarry stools; red or dark-brown urine; spitting up blood or brown material that looks like coffee grounds; red spots on the skin; unusual bruising or bleeding from the eye, gums, or nose  signs and symptoms of a blood clot such as breathing problems; changes in vision; chest pain; severe, sudden headache; pain, swelling, warmth in the leg; trouble speaking; sudden numbness or weakness of the face, arm or leg  signs and symptoms of low blood sugar such as feeling anxious; confusion; dizziness; increased hunger; unusually weak or tired; increased sweating; shakiness; cold, clammy skin; irritable; headache; blurred vision; fast heartbeat; loss of consciousness Side effects that usually do not require medical attention (report to your doctor or health care professional if they continue or are bothersome):  constipation  diarrhea  headache  upset stomach This list may not describe all possible side effects. Call your doctor for medical advice about side effects. You may report side effects to FDA at 1-800-FDA-1088. Where should I keep my medicine? Keep out of the reach of children. Store at room temperature  of 59 to 86 degrees F (15 to 30 degrees C). Throw away any unused medicine after the expiration date. NOTE: This sheet is a summary. It may not cover all possible information. If you have questions about this medicine, talk to your doctor, pharmacist, or health care provider.  2020 Elsevier/Gold Standard (2018-04-13 15:03:38)   

## 2020-03-29 ENCOUNTER — Other Ambulatory Visit (HOSPITAL_COMMUNITY): Payer: Self-pay | Admitting: Cardiovascular Disease

## 2020-03-29 ENCOUNTER — Other Ambulatory Visit: Payer: Self-pay

## 2020-03-29 ENCOUNTER — Ambulatory Visit (HOSPITAL_COMMUNITY)
Admission: RE | Admit: 2020-03-29 | Discharge: 2020-03-29 | Disposition: A | Discharge status: Home / Self Care | Payer: Medicare PPO | Source: Ambulatory Visit | Attending: Cardiovascular Disease | Admitting: Cardiovascular Disease

## 2020-03-29 DIAGNOSIS — I739 Peripheral vascular disease, unspecified: Secondary | ICD-10-CM | POA: Diagnosis not present

## 2020-04-07 ENCOUNTER — Other Ambulatory Visit: Payer: Self-pay

## 2020-04-07 ENCOUNTER — Encounter: Payer: Self-pay | Admitting: Cardiovascular Disease

## 2020-04-07 ENCOUNTER — Ambulatory Visit: Payer: Medicare PPO | Admitting: Cardiovascular Disease

## 2020-04-07 ENCOUNTER — Other Ambulatory Visit: Payer: Self-pay | Admitting: *Deleted

## 2020-04-07 VITALS — BP 142/78 | HR 80 | Temp 95.0°F | Ht 69.0 in | Wt 250.0 lb

## 2020-04-07 DIAGNOSIS — I739 Peripheral vascular disease, unspecified: Secondary | ICD-10-CM

## 2020-04-07 MED ORDER — SODIUM CHLORIDE 0.9% FLUSH: 3.0000 mL | Freq: Two times a day (BID) | INTRAVENOUS | Status: DC

## 2020-04-07 NOTE — Patient Instructions (Addendum)
    [place] [address] [place] [address] Dept: [phone] Loc: [phone]  Kyle Delgado  04/07/2020  You are scheduled for a Peripheral Angiogram on Monday, May 24 with Dr. Quay Burow.  1. Please arrive at the [facility] (Main Entrance A) at [facility]: [address] [place], [place] [address] at 5:30 AM (This time is two hours before your procedure to ensure your preparation). Free valet parking service is available.   Special note: Every effort is made to have your procedure done on time. Please understand that emergencies sometimes delay scheduled procedures.  2. Diet: Do not eat solid foods after midnight.  The patient may have clear liquids until 5am upon the day of the procedure.  3. Labs: . NONE NEEDED  GO TO [address] Friday 04-14-20 @ 10:55 AM FOR COVID TESTING  4. Medication instructions in preparation for your procedure:  DO NOT TAKE EMPAGLIFLORZIN OR JARDIANCE THE MORNING OF THE PROCEDURE  TAKE 1/2 THE EVENING DOSE OF INSULIN Sunday 5/23 AND NONE Monday MORNING  DO NOT TAKE METFORMIN 5/24, 5/25 OR 5/26 RESTART 5/27  On the morning of your procedure, take your Aspirin AND PLAVIX and any morning medicines NOT listed above.  You may use sips of water.  5. Plan for one night stay--bring personal belongings. 6. Bring a current list of your medications and current insurance cards. 7. You MUST have a responsible person to drive you home. 8. Someone MUST be with you the first 24 hours after you arrive home or your discharge will be delayed. 9. Please wear clothes that are easy to get on and off and wear slip-on shoes.  Thank you for allowing [place] to care for you!   -- [facility]   Your physician has requested that you have a lower or upper extremity arterial duplex. This test is an ultrasound of the arteries in the legs  or arms. It looks at arterial blood flow in the legs and arms. Allow one hour for Lower and Upper Arterial scans. There are no restrictions or special instructions 1 WEEK AFTER PROCEDURE  Your physician recommends that you schedule a follow-up appointment in: [address] [place]

## 2020-04-07 NOTE — Assessment & Plan Note (Signed)
Kyle Delgado returns for follow-up of his recent right SFA intervention which I performed on 03/20/2020 because of lifestyle limiting claudication.  He had orbital atherectomy of a high-grade calcified distal right SFA stenosis and Hawk 1 directional arthrectomy with DCB of the proximal right SFA using distal protection.  He did have calcified segmental mid left SFA disease and high-grade calcified left popliteal artery disease.  His right lower extremity claudication has resolved and his Dopplers have normalized.  He wishes to proceed with left SFA intervention.

## 2020-04-07 NOTE — H&P (View-Only) (Signed)
04/07/2020 Kyle Delgado   04-Nov-1946  [id number]  Primary Physician Lavone Orn, MD Primary Cardiologist: Lorretta Harp MD [name], [place]  HPI:  Kyle Delgado is a 74 y.o.  severely overweight single African-American male with no children who is retired from working at [employer] .He was referred by Dr. Laurann Montana for second opinion for evaluation treatment of PAD.  I last saw him in the office 03/14/2020.His risk factors include remote tobacco abuse, treated hypertension, diabetes and hyperlipidemia. Is never had a heart attack or stroke. There is no family history of heart disease. He denies chest pain or shortness of breath. He does have obstructive sleep apnea intolerant to CPAP. He had stenting of his LAD March 2012 in [place] and is asymptomatic from this. Over the last year or 2 has had lifestyle limiting claudication with work-up performed at the [facility] thought not to be a candidate for intervention because of CKD 3. He is referred here for second opinion.  He did have Dopplers performed in office 11/14/2019 revealing a right ABI of 0.75 and a left of 0.60.  He had a high-frequency signal in his mid right SFA with an occluded anterior tibial, moderate disease in his left SFA with occluded posterior tibial.   I performed peripheral angiography on him 03/20/2020 using a total of 170 cc of contrast.  I demonstrated bilateral SFA and tibial vessel disease.  I performed orbital atherectomy of the high-grade calcified distal right SFA stenosis and directional atherectomy of the proximal lesion using distal protection.  His right lower extremity claudication has resolved and his Dopplers have normalized.  He wishes to proceed with left lower extremity intervention for lifestyle limiting claudication.  Current Meds  Medication Sig  . aspirin 81 MG tablet Take 81 mg by mouth at bedtime.   [name] atorvastatin (LIPITOR) 80 MG tablet Take 40 mg by mouth at bedtime.  .  Carboxymethylcellulose Sodium (THERATEARS OP) Place 1 drop into both eyes daily as needed (dry eyes).  . Cholecalciferol (VITAMIN D) 50 MCG (2000 UT) tablet Take 4,000 Units by mouth daily.  . clopidogrel (PLAVIX) 75 MG tablet Take 1 tablet (75 mg total) by mouth daily.  . Emollient (EUCERIN EX) Apply 1 application topically daily.  . empagliflozin (JARDIANCE) 25 MG TABS tablet Take 12.5 mg by mouth daily.  . insulin aspart protamine- aspart (NOVOLOG MIX 70/30) (70-30) 100 UNIT/ML injection Inject 70 Units into the skin 2 (two) times daily before a meal.   . lisinopril (PRINIVIL,ZESTRIL) 40 MG tablet Take 40 mg by mouth at bedtime.   . metFORMIN (GLUCOPHAGE) 1000 MG tablet Take 1,000 mg by mouth 2 (two) times daily with a meal.  . NON FORMULARY Insulin Pen Needle 31G X 8 MM Miscellaneous as directed  . rosuvastatin (CRESTOR) 40 MG tablet Take 1 tablet (40 mg total) by mouth daily.  . sildenafil (VIAGRA) 100 MG tablet Take 100 mg by mouth daily as needed for erectile dysfunction.   Current Facility-Administered Medications for the 04/07/20 encounter (Office Visit) with Lorretta Harp, MD  Medication  . sodium chloride flush (NS) 0.9 % injection 3 mL     Allergies  Allergen Reactions  . Januvia [Sitagliptin]     pancreatitis    Social History   Socioeconomic History  . Marital status: Single    Spouse name: Not on file  . Number of children: Not on file  . Years of education: Not on file  . Highest education level:  Not on file  Occupational History  . Not on file  Tobacco Use  . Smoking status: Former Smoker    Packs/day: 1.00    Types: Cigarettes    Quit date: 2008    Years since quitting: 13.3  . Smokeless tobacco: Never Used  Substance and Sexual Activity  . Alcohol use: No  . Drug use: No  . Sexual activity: Not on file  Other Topics Concern  . Not on file  Social History Narrative  . Not on file   Social Determinants of Health   Financial Resource Strain:   .  Difficulty of Paying Living Expenses:   Food Insecurity:   . Worried About Charity fundraiser in the Last Year:   . Arboriculturist in the Last Year:   Transportation Needs:   . Film/video editor (Medical):   [name] Lack of Transportation (Non-Medical):   Physical Activity:   . Days of Exercise per Week:   . Minutes of Exercise per Session:   Stress:   . Feeling of Stress :   Social Connections:   . Frequency of Communication with Friends and Family:   . Frequency of Social Gatherings with Friends and Family:   . Attends Religious Services:   . Active Member of Clubs or Organizations:   . Attends Archivist Meetings:   [name] Marital Status:   Intimate Partner Violence:   . Fear of Current or Ex-Partner:   . Emotionally Abused:   [name] Physically Abused:   . Sexually Abused:      Review of Systems: General: negative for chills, fever, night sweats or weight changes.  Cardiovascular: negative for chest pain, dyspnea on exertion, edema, orthopnea, palpitations, paroxysmal nocturnal dyspnea or shortness of breath Dermatological: negative for rash Respiratory: negative for cough or wheezing Urologic: negative for hematuria Abdominal: negative for nausea, vomiting, diarrhea, bright red blood per rectum, melena, or hematemesis Neurologic: negative for visual changes, syncope, or dizziness All other systems reviewed and are otherwise negative except as noted above.    Blood pressure (!) 142/78, pulse 80, temperature (!) 95 F (35 C), height 5\' 9"  (1.753 m), weight 250 lb (113.4 kg).  General appearance: alert and no distress Neck: no adenopathy, no carotid bruit, no JVD, supple, symmetrical, trachea midline and thyroid not enlarged, symmetric, no tenderness/mass/nodules Lungs: clear to auscultation bilaterally Heart: regular rate and rhythm, S1, S2 normal, no murmur, click, rub or gallop Extremities: extremities normal, atraumatic, no cyanosis or edema Pulses: 2+ and  symmetric Skin: Skin color, texture, turgor normal. No rashes or lesions Neurologic: Alert and oriented X 3, normal strength and tone. Normal symmetric reflexes. Normal coordination and gait  EKG not performed today  ASSESSMENT AND PLAN:   Peripheral arterial disease [facility]) Mr. Zeiner returns for follow-up of his recent right SFA intervention which I performed on 03/20/2020 because of lifestyle limiting claudication.  He had orbital atherectomy of a high-grade calcified distal right SFA stenosis and Hawk 1 directional arthrectomy with DCB of the proximal right SFA using distal protection.  He did have calcified segmental mid left SFA disease and high-grade calcified left popliteal artery disease.  His right lower extremity claudication has resolved and his Dopplers have normalized.  He wishes to proceed with left SFA intervention.      Lorretta Harp MD FACP,FACC,FAHA, [facility] 04/07/2020 10:48 AM

## 2020-04-07 NOTE — Progress Notes (Signed)
04/07/2020 Kyle Delgado   06-Sep-1946  [id number]  Primary Physician Lavone Orn, MD Primary Cardiologist: Lorretta Harp MD [name], [place]  HPI:  Kyle Delgado is a 74 y.o.  severely overweight single African-American male with no children who is retired from working at [employer] .He was referred by Dr. Laurann Montana for second opinion for evaluation treatment of PAD.  I last saw him in the office 03/14/2020.His risk factors include remote tobacco abuse, treated hypertension, diabetes and hyperlipidemia. Is never had a heart attack or stroke. There is no family history of heart disease. He denies chest pain or shortness of breath. He does have obstructive sleep apnea intolerant to CPAP. He had stenting of his LAD March 2012 in [place] and is asymptomatic from this. Over the last year or 2 has had lifestyle limiting claudication with work-up performed at the [facility] thought not to be a candidate for intervention because of CKD 3. He is referred here for second opinion.  He did have Dopplers performed in office 11/14/2019 revealing a right ABI of 0.75 and a left of 0.60.  He had a high-frequency signal in his mid right SFA with an occluded anterior tibial, moderate disease in his left SFA with occluded posterior tibial.   I performed peripheral angiography on him 03/20/2020 using a total of 170 cc of contrast.  I demonstrated bilateral SFA and tibial vessel disease.  I performed orbital atherectomy of the high-grade calcified distal right SFA stenosis and directional atherectomy of the proximal lesion using distal protection.  His right lower extremity claudication has resolved and his Dopplers have normalized.  He wishes to proceed with left lower extremity intervention for lifestyle limiting claudication.  Current Meds  Medication Sig  . aspirin 81 MG tablet Take 81 mg by mouth at bedtime.   [name] atorvastatin (LIPITOR) 80 MG tablet Take 40 mg by mouth at bedtime.  .  Carboxymethylcellulose Sodium (THERATEARS OP) Place 1 drop into both eyes daily as needed (dry eyes).  . Cholecalciferol (VITAMIN D) 50 MCG (2000 UT) tablet Take 4,000 Units by mouth daily.  . clopidogrel (PLAVIX) 75 MG tablet Take 1 tablet (75 mg total) by mouth daily.  . Emollient (EUCERIN EX) Apply 1 application topically daily.  . empagliflozin (JARDIANCE) 25 MG TABS tablet Take 12.5 mg by mouth daily.  . insulin aspart protamine- aspart (NOVOLOG MIX 70/30) (70-30) 100 UNIT/ML injection Inject 70 Units into the skin 2 (two) times daily before a meal.   . lisinopril (PRINIVIL,ZESTRIL) 40 MG tablet Take 40 mg by mouth at bedtime.   . metFORMIN (GLUCOPHAGE) 1000 MG tablet Take 1,000 mg by mouth 2 (two) times daily with a meal.  . NON FORMULARY Insulin Pen Needle 31G X 8 MM Miscellaneous as directed  . rosuvastatin (CRESTOR) 40 MG tablet Take 1 tablet (40 mg total) by mouth daily.  . sildenafil (VIAGRA) 100 MG tablet Take 100 mg by mouth daily as needed for erectile dysfunction.   Current Facility-Administered Medications for the 04/07/20 encounter (Office Visit) with Lorretta Harp, MD  Medication  . sodium chloride flush (NS) 0.9 % injection 3 mL     Allergies  Allergen Reactions  . Januvia [Sitagliptin]     pancreatitis    Social History   Socioeconomic History  . Marital status: Single    Spouse name: Not on file  . Number of children: Not on file  . Years of education: Not on file  . Highest education level:  Not on file  Occupational History  . Not on file  Tobacco Use  . Smoking status: Former Smoker    Packs/day: 1.00    Types: Cigarettes    Quit date: 2008    Years since quitting: 13.3  . Smokeless tobacco: Never Used  Substance and Sexual Activity  . Alcohol use: No  . Drug use: No  . Sexual activity: Not on file  Other Topics Concern  . Not on file  Social History Narrative  . Not on file   Social Determinants of Health   Financial Resource Strain:   .  Difficulty of Paying Living Expenses:   Food Insecurity:   . Worried About Charity fundraiser in the Last Year:   . Arboriculturist in the Last Year:   Transportation Needs:   . Film/video editor (Medical):   [name] Lack of Transportation (Non-Medical):   Physical Activity:   . Days of Exercise per Week:   . Minutes of Exercise per Session:   Stress:   . Feeling of Stress :   Social Connections:   . Frequency of Communication with Friends and Family:   . Frequency of Social Gatherings with Friends and Family:   . Attends Religious Services:   . Active Member of Clubs or Organizations:   . Attends Archivist Meetings:   [name] Marital Status:   Intimate Partner Violence:   . Fear of Current or Ex-Partner:   . Emotionally Abused:   [name] Physically Abused:   . Sexually Abused:      Review of Systems: General: negative for chills, fever, night sweats or weight changes.  Cardiovascular: negative for chest pain, dyspnea on exertion, edema, orthopnea, palpitations, paroxysmal nocturnal dyspnea or shortness of breath Dermatological: negative for rash Respiratory: negative for cough or wheezing Urologic: negative for hematuria Abdominal: negative for nausea, vomiting, diarrhea, bright red blood per rectum, melena, or hematemesis Neurologic: negative for visual changes, syncope, or dizziness All other systems reviewed and are otherwise negative except as noted above.    Blood pressure (!) 142/78, pulse 80, temperature (!) 95 F (35 C), height 5\' 9"  (1.753 m), weight 250 lb (113.4 kg).  General appearance: alert and no distress Neck: no adenopathy, no carotid bruit, no JVD, supple, symmetrical, trachea midline and thyroid not enlarged, symmetric, no tenderness/mass/nodules Lungs: clear to auscultation bilaterally Heart: regular rate and rhythm, S1, S2 normal, no murmur, click, rub or gallop Extremities: extremities normal, atraumatic, no cyanosis or edema Pulses: 2+ and  symmetric Skin: Skin color, texture, turgor normal. No rashes or lesions Neurologic: Alert and oriented X 3, normal strength and tone. Normal symmetric reflexes. Normal coordination and gait  EKG not performed today  ASSESSMENT AND PLAN:   Peripheral arterial disease [facility]) Kyle Delgado returns for follow-up of his recent right SFA intervention which I performed on 03/20/2020 because of lifestyle limiting claudication.  He had orbital atherectomy of a high-grade calcified distal right SFA stenosis and Hawk 1 directional arthrectomy with DCB of the proximal right SFA using distal protection.  He did have calcified segmental mid left SFA disease and high-grade calcified left popliteal artery disease.  His right lower extremity claudication has resolved and his Dopplers have normalized.  He wishes to proceed with left SFA intervention.      Lorretta Harp MD FACP,FACC,FAHA, [facility] 04/07/2020 10:48 AM

## 2020-04-13 ENCOUNTER — Telehealth: Payer: Self-pay | Admitting: *Deleted

## 2020-04-13 DIAGNOSIS — I739 Peripheral vascular disease, unspecified: Secondary | ICD-10-CM | POA: Diagnosis not present

## 2020-04-13 LAB — BASIC METABOLIC PANEL
BUN/Creatinine Ratio: 16 (ref 10–24)
BUN: 24 mg/dL (ref 8–27)
CO2: 21 mmol/L (ref 20–29)
Calcium: 9.6 mg/dL (ref 8.6–10.2)
Chloride: 100 mmol/L (ref 96–106)
Creatinine, Ser: 1.49 mg/dL — ABNORMAL HIGH (ref 0.76–1.27)
GFR calc Af Amer: 53 mL/min/{1.73_m2} — ABNORMAL LOW (ref >59)
GFR calc non Af Amer: 46 mL/min/{1.73_m2} — ABNORMAL LOW (ref >59)
Glucose: 198 mg/dL — ABNORMAL HIGH (ref 65–99)
Potassium: 4.4 mmol/L (ref 3.5–5.2)
Sodium: 138 mmol/L (ref 134–144)

## 2020-04-13 LAB — CBC
Hematocrit: 51 % (ref 37.5–51.0)
Hemoglobin: 15.5 g/dL (ref 13.0–17.7)
MCH: 25.4 pg — ABNORMAL LOW (ref 26.6–33.0)
MCHC: 30.4 g/dL — ABNORMAL LOW (ref 31.5–35.7)
MCV: 84 fL (ref 79–97)
Platelets: 347 10*3/uL (ref 150–450)
RBC: 6.1 x10E6/uL — ABNORMAL HIGH (ref 4.14–5.80)
RDW: 13.4 % (ref 11.6–15.4)
WBC: 7.3 10*3/uL (ref 3.4–10.8)

## 2020-04-13 NOTE — Telephone Encounter (Signed)
Pt contacted pre-catheterization scheduled at [facility] [id number] Apr 17, 2020 7:30 AM Verified arrival time and place: [facility]) at: 5:30 AM   No solid food after midnight prior to cath, clear liquids until 5 AM day of procedure.  Hold: Metformin-day of procedure and 48 hours post procedure. Jardiance-AM of procedure Insulin-AM of procedure Pt states he does not take Insulin HS.  Except hold medications AM meds can be  taken pre-cath with sip of water including: ASA 81 mg Plavix 75 mg  Confirmed patient has responsible adult to drive home post procedure and observe 24 hours after arriving home: yes  You are allowed ONE visitor in the waiting room during your procedure. Both you and your visitor must wear masks.      COVID-19 Pre-Screening Questions:  . In the past 7 to 10 days have you had a cough,  shortness of breath, headache, congestion, fever (100 or greater) body aches, chills, sore throat, or sudden loss of taste or sense of smell? no . Have you been around anyone with known Covid 19 in the past 7 to 10 days? no . Have you been around anyone who is awaiting Covid 19 test results in the past 7 to 10 days? no . Have you been around anyone who has mentioned symptoms of Covid 19 within the past 7 to 10 days? no   Reviewed procedure/mask/visitor instructions, COVID-19 screening questions with patient.

## 2020-04-13 NOTE — Telephone Encounter (Signed)
Spoke with pt, aware he will need labs. Order placed

## 2020-04-13 NOTE — Telephone Encounter (Signed)
-----   Message from Katrine Coho, RN sent at 04/13/2020  8:54 AM EDT ----- Regarding: RE: LE angiogram 04/17/20 Hospital requires within 30 days of procedure. Could you see if he could come get BMP/CBC today? Thanks. ----- Message ----- From: Cristopher Estimable, RN Sent: 04/13/2020   8:46 AM EDT To: Katrine Coho, RN Subject: RE: LE angiogram 04/17/20                       He had them 4-20 I looked at the date wrong ----- Message ----- From: Katrine Coho, RN Sent: 04/13/2020   7:46 AM EDT To: Cristopher Estimable, RN, Katrine Coho, RN Subject: LE angiogram 04/17/20                           Suella Broad, Do you know if he has had BMP/CBC within 30 days of procedure scheduled 04/17/20?  Thanks, Webb Silversmith

## 2020-04-14 ENCOUNTER — Other Ambulatory Visit (HOSPITAL_COMMUNITY)
Admission: RE | Admit: 2020-04-14 | Discharge: 2020-04-14 | Disposition: A | Discharge status: Home / Self Care | Payer: Medicare PPO | Source: Ambulatory Visit | Attending: Cardiovascular Disease | Admitting: Cardiovascular Disease

## 2020-04-14 DIAGNOSIS — Z20822 Contact with and (suspected) exposure to covid-19: Secondary | ICD-10-CM | POA: Insufficient documentation

## 2020-04-14 DIAGNOSIS — Z01812 Encounter for preprocedural laboratory examination: Secondary | ICD-10-CM | POA: Diagnosis not present

## 2020-04-14 LAB — SARS CORONAVIRUS 2 (TAT 6-24 HRS): SARS Coronavirus 2: NEGATIVE

## 2020-04-17 ENCOUNTER — Encounter (HOSPITAL_COMMUNITY): Payer: Self-pay | Admitting: Cardiovascular Disease

## 2020-04-17 ENCOUNTER — Encounter (HOSPITAL_COMMUNITY)
Admission: RE | Disposition: A | Discharge status: Home / Self Care | Payer: Self-pay | Source: Home / Self Care | Attending: Cardiovascular Disease

## 2020-04-17 ENCOUNTER — Ambulatory Visit (HOSPITAL_COMMUNITY)
Admission: RE | Admit: 2020-04-17 | Discharge: 2020-04-18 | Disposition: A | Discharge status: Home / Self Care | Payer: Medicare PPO | Attending: Cardiovascular Disease | Admitting: Cardiovascular Disease

## 2020-04-17 ENCOUNTER — Other Ambulatory Visit: Payer: Self-pay

## 2020-04-17 DIAGNOSIS — I70212 Atherosclerosis of native arteries of extremities with intermittent claudication, left leg: Secondary | ICD-10-CM | POA: Diagnosis not present

## 2020-04-17 DIAGNOSIS — Z87891 Personal history of nicotine dependence: Secondary | ICD-10-CM | POA: Insufficient documentation

## 2020-04-17 DIAGNOSIS — Z79899 Other long term (current) drug therapy: Secondary | ICD-10-CM | POA: Insufficient documentation

## 2020-04-17 DIAGNOSIS — E663 Overweight: Secondary | ICD-10-CM | POA: Insufficient documentation

## 2020-04-17 DIAGNOSIS — Z955 Presence of coronary angioplasty implant and graft: Secondary | ICD-10-CM | POA: Diagnosis not present

## 2020-04-17 DIAGNOSIS — N183 Chronic kidney disease, stage 3 unspecified: Secondary | ICD-10-CM | POA: Diagnosis not present

## 2020-04-17 DIAGNOSIS — I129 Hypertensive chronic kidney disease with stage 1 through stage 4 chronic kidney disease, or unspecified chronic kidney disease: Secondary | ICD-10-CM | POA: Diagnosis not present

## 2020-04-17 DIAGNOSIS — Z7902 Long term (current) use of antithrombotics/antiplatelets: Secondary | ICD-10-CM | POA: Diagnosis not present

## 2020-04-17 DIAGNOSIS — E1122 Type 2 diabetes mellitus with diabetic chronic kidney disease: Secondary | ICD-10-CM | POA: Insufficient documentation

## 2020-04-17 DIAGNOSIS — Z7982 Long term (current) use of aspirin: Secondary | ICD-10-CM | POA: Diagnosis not present

## 2020-04-17 DIAGNOSIS — I2510 Atherosclerotic heart disease of native coronary artery without angina pectoris: Secondary | ICD-10-CM | POA: Diagnosis present

## 2020-04-17 DIAGNOSIS — Z794 Long term (current) use of insulin: Secondary | ICD-10-CM | POA: Diagnosis not present

## 2020-04-17 DIAGNOSIS — Z6837 Body mass index (BMI) 37.0-37.9, adult: Secondary | ICD-10-CM | POA: Insufficient documentation

## 2020-04-17 DIAGNOSIS — I739 Peripheral vascular disease, unspecified: Secondary | ICD-10-CM | POA: Diagnosis present

## 2020-04-17 DIAGNOSIS — G4733 Obstructive sleep apnea (adult) (pediatric): Secondary | ICD-10-CM | POA: Insufficient documentation

## 2020-04-17 DIAGNOSIS — E785 Hyperlipidemia, unspecified: Secondary | ICD-10-CM | POA: Insufficient documentation

## 2020-04-17 DIAGNOSIS — I10 Essential (primary) hypertension: Secondary | ICD-10-CM | POA: Diagnosis present

## 2020-04-17 HISTORY — PX: PERIPHERAL VASCULAR ATHERECTOMY: CATH118256

## 2020-04-17 HISTORY — PX: PERIPHERAL VASCULAR BALLOON ANGIOPLASTY: CATH118281

## 2020-04-17 HISTORY — PX: LOWER EXTREMITY ANGIOGRAPHY: CATH118251

## 2020-04-17 LAB — GLUCOSE, CAPILLARY
Glucose-Capillary: 87 mg/dL (ref 70–99)
Glucose-Capillary: 117 mg/dL — ABNORMAL HIGH (ref 70–99)
Glucose-Capillary: 148 mg/dL — ABNORMAL HIGH (ref 70–99)
Glucose-Capillary: 154 mg/dL — ABNORMAL HIGH (ref 70–99)

## 2020-04-17 LAB — POCT ACTIVATED CLOTTING TIME
Activated Clotting Time: 257 seconds
Activated Clotting Time: 279 seconds
Activated Clotting Time: 290 seconds
Activated Clotting Time: 279 seconds
Activated Clotting Time: 202 seconds
Activated Clotting Time: 169 seconds

## 2020-04-17 SURGERY — LOWER EXTREMITY ANGIOGRAPHY
Anesthesia: LOCAL

## 2020-04-17 MED ORDER — FENTANYL CITRATE (PF) 100 MCG/2ML IJ SOLN
INTRAMUSCULAR | Status: DC | PRN
  Administered 2020-04-17 (×2): 25 ug via INTRAVENOUS

## 2020-04-17 MED ORDER — HYDRALAZINE HCL 20 MG/ML IJ SOLN
5.0000 mg | INTRAMUSCULAR | Status: AC | PRN
  Administered 2020-04-17 (×2): 5 mg via INTRAVENOUS

## 2020-04-17 MED ORDER — SODIUM CHLORIDE 0.9% FLUSH: 3.0000 mL | INTRAVENOUS | Status: DC | PRN

## 2020-04-17 MED ORDER — VIPERSLIDE LUBRICANT OPTIME: TOPICAL | Status: DC | PRN

## 2020-04-17 MED ORDER — ONDANSETRON HCL 4 MG/2ML IJ SOLN: 4.0000 mg | Freq: Four times a day (QID) | INTRAMUSCULAR | Status: DC | PRN

## 2020-04-17 MED ORDER — MORPHINE SULFATE (PF) 2 MG/ML IV SOLN
INTRAVENOUS | Status: AC
  Filled 2020-04-17: qty 1

## 2020-04-17 MED ORDER — HEPARIN SODIUM (PORCINE) 1000 UNIT/ML IJ SOLN
INTRAMUSCULAR | Status: DC | PRN
  Administered 2020-04-17 (×2): 2500 [IU] via INTRAVENOUS
  Administered 2020-04-17: 4000 [IU] via INTRAVENOUS
  Administered 2020-04-17: 12000 [IU] via INTRAVENOUS

## 2020-04-17 MED ORDER — NITROGLYCERIN IN D5W 200-5 MCG/ML-% IV SOLN
INTRAVENOUS | Status: AC
  Filled 2020-04-17: qty 250

## 2020-04-17 MED ORDER — SODIUM CHLORIDE 0.9 % IV SOLN: 250.0000 mL | INTRAVENOUS | Status: DC | PRN

## 2020-04-17 MED ORDER — LIDOCAINE HCL (PF) 1 % IJ SOLN
INTRAMUSCULAR | Status: DC | PRN
  Administered 2020-04-17: 20 mL

## 2020-04-17 MED ORDER — SODIUM CHLORIDE 0.9 % IV SOLN
INTRAVENOUS | Status: AC
  Administered 2020-04-17: 75 mL/h via INTRAVENOUS

## 2020-04-17 MED ORDER — CLOPIDOGREL BISULFATE 75 MG PO TABS: 75.0000 mg | ORAL_TABLET | Freq: Every day | ORAL | Status: DC

## 2020-04-17 MED ORDER — CLOPIDOGREL BISULFATE 75 MG PO TABS
75.0000 mg | ORAL_TABLET | Freq: Every day | ORAL | Status: DC
  Administered 2020-04-18: 75 mg via ORAL
  Filled 2020-04-17: qty 1

## 2020-04-17 MED ORDER — HEPARIN SODIUM (PORCINE) 1000 UNIT/ML IJ SOLN
INTRAMUSCULAR | Status: AC
  Filled 2020-04-17: qty 1

## 2020-04-17 MED ORDER — ASPIRIN EC 81 MG PO TBEC
81.0000 mg | DELAYED_RELEASE_TABLET | Freq: Every day | ORAL | Status: DC
  Administered 2020-04-18: 81 mg via ORAL
  Filled 2020-04-17: qty 1

## 2020-04-17 MED ORDER — ASPIRIN EC 81 MG PO TBEC: 81.0000 mg | DELAYED_RELEASE_TABLET | Freq: Every day | ORAL | Status: DC

## 2020-04-17 MED ORDER — INSULIN ASPART PROT & ASPART (70-30 MIX) 100 UNIT/ML ~~LOC~~ SUSP
70.0000 [IU] | Freq: Two times a day (BID) | SUBCUTANEOUS | Status: DC
  Administered 2020-04-17 – 2020-04-18 (×2): 70 [IU] via SUBCUTANEOUS
  Filled 2020-04-17: qty 10

## 2020-04-17 MED ORDER — SODIUM CHLORIDE 0.9% FLUSH
3.0000 mL | Freq: Two times a day (BID) | INTRAVENOUS | Status: DC
  Administered 2020-04-17: 3 mL via INTRAVENOUS

## 2020-04-17 MED ORDER — MIDAZOLAM HCL 2 MG/2ML IJ SOLN
INTRAMUSCULAR | Status: AC
  Filled 2020-04-17: qty 2

## 2020-04-17 MED ORDER — ASPIRIN 81 MG PO CHEW: 81.0000 mg | CHEWABLE_TABLET | ORAL | Status: DC

## 2020-04-17 MED ORDER — LIDOCAINE HCL (PF) 1 % IJ SOLN
INTRAMUSCULAR | Status: AC
  Filled 2020-04-17: qty 30

## 2020-04-17 MED ORDER — FENTANYL CITRATE (PF) 100 MCG/2ML IJ SOLN
INTRAMUSCULAR | Status: AC
  Filled 2020-04-17: qty 2

## 2020-04-17 MED ORDER — SODIUM CHLORIDE 0.9 % WEIGHT BASED INFUSION: 1.0000 mL/kg/h | INTRAVENOUS | Status: DC

## 2020-04-17 MED ORDER — VERAPAMIL HCL 2.5 MG/ML IV SOLN
INTRAVENOUS | Status: AC
  Filled 2020-04-17: qty 2

## 2020-04-17 MED ORDER — HEPARIN (PORCINE) IN NACL 1000-0.9 UT/500ML-% IV SOLN
INTRAVENOUS | Status: AC
  Filled 2020-04-17: qty 500

## 2020-04-17 MED ORDER — LISINOPRIL 40 MG PO TABS
40.0000 mg | ORAL_TABLET | Freq: Every day | ORAL | Status: DC
  Administered 2020-04-17: 40 mg via ORAL
  Filled 2020-04-17: qty 1

## 2020-04-17 MED ORDER — MIDAZOLAM HCL 2 MG/2ML IJ SOLN
INTRAMUSCULAR | Status: DC | PRN
  Administered 2020-04-17 (×2): 1 mg via INTRAVENOUS

## 2020-04-17 MED ORDER — IODIXANOL 320 MG/ML IV SOLN
INTRAVENOUS | Status: DC | PRN
  Administered 2020-04-17: 95 mL via INTRA_ARTERIAL

## 2020-04-17 MED ORDER — ACETAMINOPHEN 325 MG PO TABS: 650.0000 mg | ORAL_TABLET | ORAL | Status: DC | PRN

## 2020-04-17 MED ORDER — HYDRALAZINE HCL 20 MG/ML IJ SOLN
INTRAMUSCULAR | Status: AC
  Filled 2020-04-17: qty 1

## 2020-04-17 MED ORDER — LABETALOL HCL 5 MG/ML IV SOLN
10.0000 mg | INTRAVENOUS | Status: DC | PRN
  Administered 2020-04-17: 10 mg via INTRAVENOUS

## 2020-04-17 MED ORDER — ATORVASTATIN CALCIUM 40 MG PO TABS: 40.0000 mg | ORAL_TABLET | Freq: Every day | ORAL | Status: DC

## 2020-04-17 MED ORDER — HEPARIN (PORCINE) IN NACL 1000-0.9 UT/500ML-% IV SOLN
INTRAVENOUS | Status: AC
  Filled 2020-04-17: qty 1000

## 2020-04-17 MED ORDER — NITROGLYCERIN 1 MG/10 ML FOR IR/CATH LAB
INTRA_ARTERIAL | Status: DC | PRN
  Administered 2020-04-17 (×4): 200 ug via INTRA_ARTERIAL

## 2020-04-17 MED ORDER — LABETALOL HCL 5 MG/ML IV SOLN
INTRAVENOUS | Status: AC
  Filled 2020-04-17: qty 4

## 2020-04-17 MED ORDER — SODIUM CHLORIDE 0.9 % WEIGHT BASED INFUSION
3.0000 mL/kg/h | INTRAVENOUS | Status: DC
  Administered 2020-04-17: 3 mL/kg/h via INTRAVENOUS

## 2020-04-17 MED ORDER — ATORVASTATIN CALCIUM 80 MG PO TABS
80.0000 mg | ORAL_TABLET | Freq: Every day | ORAL | Status: DC
  Administered 2020-04-17 – 2020-04-18 (×2): 80 mg via ORAL
  Filled 2020-04-17 (×2): qty 1

## 2020-04-17 MED ORDER — EMPAGLIFLOZIN 25 MG PO TABS
25.0000 mg | ORAL_TABLET | Freq: Every day | ORAL | Status: DC
  Filled 2020-04-17 (×4): qty 1

## 2020-04-17 MED ORDER — MORPHINE SULFATE (PF) 2 MG/ML IV SOLN
2.0000 mg | INTRAVENOUS | Status: DC | PRN
  Administered 2020-04-17: 2 mg via INTRAVENOUS

## 2020-04-17 MED ORDER — HEPARIN (PORCINE) IN NACL 1000-0.9 UT/500ML-% IV SOLN
INTRAVENOUS | Status: DC | PRN
  Administered 2020-04-17 (×2): 500 mL

## 2020-04-17 SURGICAL SUPPLY — 34 items
BALLN ADMIRAL INPACT 5X200 (BALLOONS) ×3
BALLN CHOCOLATE 4.0X80X135 (BALLOONS) ×3
BALLOON ADMIRAL INPACT 5X200 (BALLOONS) IMPLANT
BALLOON CHOCOLATE 4.0X80X135 (BALLOONS) IMPLANT
BUR DBK EXCH CART 1.50 SOL145 (BURR) IMPLANT
BURR DBK EXCH CART 1.50 SOL145 (BURR) ×3
CATH ANGIO 5F BER2 65CM (CATHETERS) ×1 IMPLANT
CATH CROSS OVER TEMPO 5F (CATHETERS) ×1 IMPLANT
CATH QUICKCROSS .018X135CM (MICROCATHETER) ×1 IMPLANT
CATH SOFT-VU 4F 65 STRAIGHT (CATHETERS) IMPLANT
CATH SOFT-VU STRAIGHT 4F 65CM (CATHETERS) ×3
CROWN 2.0 SOLID 145 DIAMONDBK (BURR) ×1 IMPLANT
DCB RANGER 5.0X100 135 (BALLOONS) IMPLANT
DEVICE EMBOSHIELD NAV6 4.0-7.0 (FILTER) ×1 IMPLANT
GUIDEWIRE ANGLED .035X150CM (WIRE) ×1 IMPLANT
GUIDEWIRE ZILIENT 6G 014 (WIRE) ×1 IMPLANT
KIT ENCORE 26 ADVANTAGE (KITS) ×1 IMPLANT
KIT PV (KITS) ×3 IMPLANT
LUBRICANT VIPERSLIDE CORONARY (MISCELLANEOUS) ×1 IMPLANT
RANGER DCB 5.0X100 135 (BALLOONS) ×3
SHEATH HIGHFLEX ANSEL 7FR 55CM (SHEATH) ×1 IMPLANT
SHEATH PINNACLE 5F 10CM (SHEATH) ×1 IMPLANT
SHEATH PINNACLE 7F 10CM (SHEATH) ×2 IMPLANT
SHEATH PINNACLE MP 7F 45CM (SHEATH) ×1 IMPLANT
SHEATH PROBE COVER 6X72 (BAG) ×2 IMPLANT
STOPCOCK MORSE 400PSI 3WAY (MISCELLANEOUS) ×1 IMPLANT
TAPE VIPERTRACK RADIOPAQ (MISCELLANEOUS) IMPLANT
TAPE VIPERTRACK RADIOPAQUE (MISCELLANEOUS) ×6
TRANSDUCER W/STOPCOCK (MISCELLANEOUS) ×3 IMPLANT
TRAY PV CATH (CUSTOM PROCEDURE TRAY) ×3 IMPLANT
TUBING CIL FLEX 10 FLL-RA (TUBING) ×1 IMPLANT
WIRE HITORQ VERSACORE ST 145CM (WIRE) ×1 IMPLANT
WIRE ROSEN-J .035X260CM (WIRE) ×1 IMPLANT
WIRE VIPER ADVANCE .017X335CM (WIRE) ×1 IMPLANT

## 2020-04-17 NOTE — Interval H&P Note (Signed)
History and Physical Interval Note:  04/17/2020 7:49 AM  Kyle Delgado  has presented today for surgery, with the diagnosis of pad.  The various methods of treatment have been discussed with the patient and family. After consideration of risks, benefits and other options for treatment, the patient has consented to  Procedure(s): LOWER EXTREMITY ANGIOGRAPHY (N/A) as a surgical intervention.  The patient's history has been reviewed, patient examined, no change in status, stable for surgery.  I have reviewed the patient's chart and labs.  Questions were answered to the patient's satisfaction.     Quay Burow

## 2020-04-17 NOTE — Progress Notes (Signed)
71fr sheath aspirated and removed by Suella Broad R.N.  Manual pressure applied. I took over manual pressure and continued for 25 minutes.Site level 0, tender to the touch. Tegaderm dressing applied, bedrest instructions given.  Right dp palpable, left dp intermittently palpable.  Bedrest begins at 14:00:00

## 2020-04-18 DIAGNOSIS — N183 Chronic kidney disease, stage 3 unspecified: Secondary | ICD-10-CM | POA: Diagnosis not present

## 2020-04-18 DIAGNOSIS — I70212 Atherosclerosis of native arteries of extremities with intermittent claudication, left leg: Secondary | ICD-10-CM | POA: Diagnosis not present

## 2020-04-18 DIAGNOSIS — E663 Overweight: Secondary | ICD-10-CM | POA: Diagnosis not present

## 2020-04-18 DIAGNOSIS — G4733 Obstructive sleep apnea (adult) (pediatric): Secondary | ICD-10-CM | POA: Diagnosis not present

## 2020-04-18 DIAGNOSIS — E785 Hyperlipidemia, unspecified: Secondary | ICD-10-CM | POA: Diagnosis not present

## 2020-04-18 DIAGNOSIS — I739 Peripheral vascular disease, unspecified: Secondary | ICD-10-CM | POA: Diagnosis not present

## 2020-04-18 DIAGNOSIS — Z87891 Personal history of nicotine dependence: Secondary | ICD-10-CM | POA: Diagnosis not present

## 2020-04-18 DIAGNOSIS — I129 Hypertensive chronic kidney disease with stage 1 through stage 4 chronic kidney disease, or unspecified chronic kidney disease: Secondary | ICD-10-CM | POA: Diagnosis not present

## 2020-04-18 DIAGNOSIS — Z955 Presence of coronary angioplasty implant and graft: Secondary | ICD-10-CM | POA: Diagnosis not present

## 2020-04-18 DIAGNOSIS — E1122 Type 2 diabetes mellitus with diabetic chronic kidney disease: Secondary | ICD-10-CM | POA: Diagnosis not present

## 2020-04-18 LAB — GLUCOSE, CAPILLARY: Glucose-Capillary: 85 mg/dL (ref 70–99)

## 2020-04-18 LAB — BASIC METABOLIC PANEL
Anion gap: 9 (ref 5–15)
BUN: 22 mg/dL (ref 8–23)
CO2: 23 mmol/L (ref 22–32)
Calcium: 8.8 mg/dL — ABNORMAL LOW (ref 8.9–10.3)
Chloride: 107 mmol/L (ref 98–111)
Creatinine, Ser: 1.36 mg/dL — ABNORMAL HIGH (ref 0.61–1.24)
GFR calc Af Amer: 59 mL/min — ABNORMAL LOW (ref >60)
GFR calc non Af Amer: 51 mL/min — ABNORMAL LOW (ref >60)
Glucose, Bld: 79 mg/dL (ref 70–99)
Potassium: 3.9 mmol/L (ref 3.5–5.1)
Sodium: 139 mmol/L (ref 135–145)

## 2020-04-18 LAB — CBC
HCT: 49.1 % (ref 39.0–52.0)
Hemoglobin: 15.2 g/dL (ref 13.0–17.0)
MCH: 26 pg (ref 26.0–34.0)
MCHC: 31 g/dL (ref 30.0–36.0)
MCV: 84.1 fL (ref 80.0–100.0)
Platelets: 317 10*3/uL (ref 150–400)
RBC: 5.84 MIL/uL — ABNORMAL HIGH (ref 4.22–5.81)
RDW: 13.7 % (ref 11.5–15.5)
WBC: 6.4 10*3/uL (ref 4.0–10.5)
nRBC: 0 % (ref 0.0–0.2)

## 2020-04-18 MED FILL — Heparin Sod (Porcine)-NaCl IV Soln 1000 Unit/500ML-0.9%: INTRAVENOUS | Qty: 500 | Status: AC

## 2020-04-18 NOTE — Discharge Summary (Addendum)
Discharge Summary    Patient ID: Kyle Delgado MRN: [id number]; DOB: [birth year]  Admit date: 04/17/2020 Discharge date: 04/18/2020  Primary Care Provider: Lavone Orn, MD  Primary Cardiologist: Quay Burow, MD  Primary Electrophysiologist:  None   Discharge Diagnoses    Principal Problem:   Peripheral arterial disease [facility]) Active Problems:   Essential hypertension   Hyperlipidemia   Type 2 diabetes mellitus without complication, with long-term current use of insulin ([place])   Coronary artery disease   Claudication in peripheral vascular disease ([place])    Diagnostic Studies/Procedures   PV Angiogram/Intervention 04/17/2020: Final Impression: Successful diamondback orbital rotational atherectomy, drug-coated balloon angioplasty of a subtotally occluded calcified left popliteal artery and high-grade long segmental mid left SFA using a NAV 6 distal protection with excellent angiographic result.  The patient was already on aspirin Plavix.  The sheath will be removed once ACT falls below 170 and pressure held.  Patient left lab in stable condition.  We will keep him overnight to hydrate him, plan for discharge home tomorrow.  We will obtain lower extremity arterial Doppler studies of his left leg in our [place] line office next week and I will see him back the week after in follow-up.  He left the lab in stable condition. _____________   History of Present Illness     Kyle Delgado is a 74 y.o. male with a history of CAD s/p DES to mid LAD in 01/2011, PAD s/p orbital atherectomy of the a high-grade calcified distal right SGA stenosis and direction atherectomy of the proximal lesion in 02/2020, hypertension, hyperlipidemia, diabetes mellitus, sciatica, and CKD stage III.   Patient follows with Dr. Gwenlyn Found for PAD with lifestyle limiting claudication. Dopplers in 10/2019 revealed a right ABI of 0.75 and a left ABI of 0.60. He had a high-frequency signal in his mid right SFA with an occluded  anterior tibial and moderate disease in his left SFA with occluded posterior tibial. Dr. Gwenlyn Found performed peripheral angiography on 02/25/2020 which demonstrated bilateral SFA and tibial vessel disease. He performed orbital atherectomy of the high-grade calcified distal right SFA stenosis and directional atherectomy of the proximal lesion using distal protection. His right lower extremity claudication has resolved and his Dopplers have normalized.   He was seen by Dr. Gwenlyn Found in the office on 04/07/2020 and wished to proceed with left lower extremity intervention for lifestyle limiting claudication. Therefore, outpatient peripheral angiography with planned intervention was scheduled.   Hospital Course     Consultants: None  Patient presented for outpatient peripheral angiography with intervention on 04/17/2020. Dr. Gwenlyn Found performed a successful diamondback orbital rotational atherectomy with drug-coated balloon angioplasty of a subtotally occluded calcified left popliteal artery and high-grade long segmental mid left SFA using a NAV 6 distal protection with excellent angiographic results. Patient tolerated the procedure well. He was admitted overnight for IV hydration.  Right femoral cath site soft with no signs of hematoma. Renal function stable. Will make sure he can ambulate without any problems prior to discharge. Patient already on Aspirin and Plavix at home which will need to be continued for a minimum of 12 months. He has repeat lower extremity dopplers scheduled for 04/25/2020 and follow-up with Dr. Gwenlyn Found scheduled for 05/03/2020.   Patient seen and examined by Dr. Johnsie Cancel today and determined to be stable for discharge. Outpatient follow-up has been arranged. Medications as below.   Did the patient have an acute coronary syndrome (MI, NSTEMI, STEMI, etc) this admission?:  No  Did the patient have a percutaneous coronary intervention (stent / angioplasty)?:  No.    _____________  Discharge Vitals Blood pressure 133/86, pulse 78, temperature 98.4 F (36.9 C), temperature source Oral, resp. rate 12, height 5\' 9"  (1.753 m), weight 113.8 kg, SpO2 96 %.  Filed Weights   04/17/20 0537 04/18/20 0130  Weight: 117.9 kg 113.8 kg   General: 74 y.o. African-American male resting comfortably in no acute distress. HEENT: Normocephalic and atraumatic. Sclera clear.  Neck: Supple.  Heart: RRR. Distinct S1 and S2. No murmurs, gallops, or rubs. Radial pulses 2+ and equal bilaterally. Right femoral cath site soft with no signs of hematoma. Lungs: No increased work of breathing. Clear to ausculation bilaterally. No wheezes, rhonchi, or rales.  Abdomen: Soft, non-distended, and non-tender to palpation.  Extremities: No lower extremity edema.    Skin: Warm and dry. Neuro: No focal deficits. Psych: Normal affect. Responds appropriately.  Labs & Radiologic Studies    CBC Recent Labs    04/18/20 0424  WBC 6.4  HGB 15.2  HCT 49.1  MCV 84.1  PLT [id number]   Basic Metabolic Panel Recent Labs    04/18/20 0424  NA 139  K 3.9  CL 107  CO2 23  GLUCOSE 79  BUN 22  CREATININE 1.36*  CALCIUM 8.8*   Liver Function Tests No results for input(s): AST, ALT, ALKPHOS, BILITOT, PROT, ALBUMIN in the last 72 hours. No results for input(s): LIPASE, AMYLASE in the last 72 hours. High Sensitivity Troponin:   No results for input(s): TROPONINIHS in the last 720 hours.  BNP Invalid input(s): POCBNP D-Dimer No results for input(s): DDIMER in the last 72 hours. Hemoglobin A1C No results for input(s): HGBA1C in the last 72 hours. Fasting Lipid Panel No results for input(s): CHOL, HDL, LDLCALC, TRIG, CHOLHDL, LDLDIRECT in the last 72 hours. Thyroid Function Tests No results for input(s): TSH, T4TOTAL, T3FREE, THYROIDAB in the last 72 hours.  Invalid input(s): FREET3 _____________  PERIPHERAL VASCULAR CATHETERIZATION  Result Date: 04/17/2020  ID:[id number] LOCATION:   FACILITY: [facility] PHYSICIAN: Quay Burow, M.D. 01-21-1946 DATE OF PROCEDURE:  04/17/2020 DATE OF DISCHARGE: PV Angiogram/Intervention History obtained from chart review.Kyle Delgado is a 74 y.o.  severely overweight single African-American male with no children who is retired from working at [employer] .  He was referred by Dr. Laurann Montana for second opinion for evaluation treatment of PAD.  I last saw him in the office 03/14/2020. His risk factors include remote tobacco abuse, treated hypertension, diabetes and hyperlipidemia.  Is never had a heart attack or stroke.  There is no family history of heart disease.  He denies chest pain or shortness of breath.  He does have obstructive sleep apnea intolerant to CPAP.  He had stenting of his LAD March 2012 in [place] and is asymptomatic from this.  Over the last year or 2 has had lifestyle limiting claudication with work-up performed at the [facility] thought not to be a candidate for intervention because of CKD 3.  He is referred here for second opinion.   He did have Dopplers performed in office 11/14/2019 revealing a right ABI of 0.75 and a left of 0.60.  He had a high-frequency signal in his mid right SFA with an occluded anterior tibial, moderate disease in his left SFA with occluded posterior tibial.   I performed peripheral angiography on him 03/20/2020 using a total of 170 cc of contrast.  I demonstrated bilateral SFA and tibial vessel disease.  I performed orbital  atherectomy of the high-grade calcified distal right SFA stenosis and directional atherectomy of the proximal lesion using distal protection.  His right lower extremity claudication has resolved and his Dopplers have normalized.  He wishes to proceed with left lower extremity intervention for lifestyle limiting claudication. Pre Procedure Diagnosis: Claudication Post Procedure Diagnosis: Claudication Operators: Dr. Quay Burow Procedures Performed:  1.  Ultrasound-guided right common femoral access   2.  Contralateral access (secondary catheter placement)  3.  Diamondback orbital rotational atherectomy, chocolate balloon angioplasty, DCB calcified left popliteal artery  4.  Placement of NAV 6 distal protection device in left above-the-knee popliteal artery  5.  Diamondback orbital rotational atherectomy, drug-coated balloon angioplasty mid left SFA PROCEDURE DESCRIPTION: The patient was brought to the second floor [place] [facility] cath lab in the the postabsorptive state. He was premedicated with IV Versed and fentanyl. His right groin was prepped and shaved in usual sterile fashion. Xylocaine 1% was used for local anesthesia. A 5 French sheath was inserted into the right common femoral artery using standard Seldinger technique.  Ultrasound was used to identify the right common femoral artery which was calcified and deep into guide access.  Access was obtained under ultrasound guidance with an anterior wall stick.  A picture was obtained and saved to the patient's chart.  Contralateral access was obtained with a crossover catheter, Glidewire, 0354 French endhole catheter and an 035 Rosen wire followed by a 7 [place] destination sheath.  Isovue dye was used for the entirety of the intervention.  Retrograde aortic pressures monitored in the case. The patient received a total of 21,000 units of heparin with an ACT of 290.  A total of 95 cc of contrast was administered to the patient.  I was able to cross the mid SFA calcified segment with 018 quick cross and a 014 6 g Zilient wire.  This is a wire actually went across the subtotally occluded popliteal lesion as well as did the quick cross.  I then exchanged for an 014/017 Viper wire and performed orbital atherectomy with a 1.5 mm solid bur up to 90,000 RPM.  Following this I performed Cutting Balloon angioplasty with a 4 mm x 80 mm long shock balloon followed by DCB using a 5 mm x 100 mm long Ranger drug-coated balloon at nominal pressures for 2 and half minutes  resulting reduction of a 99% subtotally occluded calcified left popliteal artery stenosis to less than 20% residual with excellent flow and no dissection. Following this I placed a NAV 6 distal protection device in the left above-the-knee popliteal artery and switched out the 1.5 mm solid bur for 2 mm solid bur and performed orbital atherectomy of the calcified mid left SFA up 220,000 RPMs.  I used copious amounts of intra-arterial nitroglycerin during the case.  Following this I performed the CD with a 5 mm x 200 mm long impact Admiral balloon at nominal pressures for 2 and half minutes resulting reduction of the long 80 to 90% calcified mid left SFA stenosis less than 20% residual with excellent flow.  Following this I retrieved the NAV 6 filter successfully.  There was a moderate amount of "EMBO dust" in the filter.  I then exchanged the destination sheath for a short 7 French sheath over an 035 versa core wire and secured this in place. Final Impression: Successful diamondback orbital rotational atherectomy, drug-coated balloon angioplasty of a subtotally occluded calcified left popliteal artery and high-grade long segmental mid left SFA using a NAV 6  distal protection with excellent angiographic result.  The patient was already on aspirin Plavix.  The sheath will be removed once ACT falls below 170 and pressure held.  Patient left lab in stable condition.  We will keep him overnight to hydrate him, plan for discharge home tomorrow.  We will obtain lower extremity arterial Doppler studies of his left leg in our [place] line office next week and I will see him back the week after in follow-up.  He left the lab in stable condition. Quay Burow. MD, [facility] 04/17/2020 10:20 AM   PERIPHERAL VASCULAR CATHETERIZATION  Result Date: 03/20/2020  ID:[id number] LOCATION:  FACILITY: [facility] PHYSICIAN: Quay Burow, M.D. 1946/03/24 DATE OF PROCEDURE:  03/20/2020 DATE OF DISCHARGE: PV Angiogram/Intervention History obtained from  chart review.Kyle Delgado is a 74 y.o.  severely overweight single African-American male with no children who is retired from working at [employer] .  He was referred by Dr. Laurann Montana for second opinion for evaluation treatment of PAD.  I last saw him in the office 11/03/2019. His risk factors include remote tobacco abuse, treated hypertension, diabetes and hyperlipidemia.  Is never had a heart attack or stroke.  There is no family history of heart disease.  He denies chest pain or shortness of breath.  He does have obstructive sleep apnea intolerant to CPAP.  He had stenting of his LAD March 2012 in [place] and is asymptomatic from this.  Over the last year or 2 has had lifestyle limiting claudication with work-up performed at the [facility] thought not to be a candidate for intervention because of CKD 3.  He is referred here for second opinion.   He did have Dopplers performed in office 11/14/2019 revealing a right ABI of 0.75 and a left of 0.60.  He had a high-frequency signal in his mid right SFA with an occluded anterior tibial, moderate disease in his left SFA with occluded posterior tibial.  He wishes to proceed with angiography and potential endovascular therapy for lifestyle limiting claudication. Pre Procedure Diagnosis: Peripheral arterial disease/claudication Post Procedure Diagnosis: Peripheral arterial disease/claudication Operators: Dr. Quay Burow Procedures Performed:  1.  Ultrasound-guided left common femoral access  2.  Abdominal aortogram/bilateral iliac angiogram/bifemoral runoff  3.  Contralateral access (secondary catheter placement)  4.  Placement of an NAV 6 distal protection device in the right below the knee popliteal artery.  5.  Diamondback orbital rotational atherectomy distal right SFA/DCB distal right SFA  6.  Hawk 1 directional atherectomy proximal right SFA/DCB proximal right SFA  7.  Left common femoral angiogram, Mynx closure PROCEDURE DESCRIPTION: The patient was brought  to the second floor [place] [facility] cath lab in the the postabsorptive state. He was premedicated with IV Versed and fentanyl. His left groin was prepped and shaved in usual sterile fashion. Xylocaine 1% was used for local anesthesia. A 5 French sheath was inserted into the left common femoral artery using standard Seldinger technique.  A 5 French pigtail catheter was placed in the distal abdominal aorta.  Distal abdominal aortography, bilateral iliac angiography with bifemoral runoff was performed using bolus chase, digital subtraction and step table technique.  Isovue dye was used for the entirety of the case.  Retrograde aortic pressures monitored during the case.  Angiographic Data: 1: Abdominal aortogram-the distal common ureter was widely patent 2: Left lower extremity-80% diffuse segmental mid left SFA stenosis.  99% calcified left popliteal artery stenosis with three-vessel runoff.  The anterior tibial had multiple tandem 95% calcified lesions 3: Right  lower extremity-90% calcified proximal right SFA stenosis, 99% calcified distal right SFA stenosis, two-vessel runoff with occluded anterior tibial   Mr. Lanteigne has high-grade proximal and distal right SFA stenosis with lifestyle limiting claudication.  We will proceed with orbital atherectomy of the distal lesion, directional atherectomy the proximal lesion with NAV 6 distal protection. Procedure Description: Contralateral access was obtained with a 7 [place] 55 cm multipurpose Ansell sheath.  Patient received a total of 18,000 as of heparin with a peak ACT of 290.  Total contrast administered to the patient is was 170 cc.  Sedation time time was 130 minutes. I was able with some difficulty using a 014 angled CXI endhole catheter and a 6 gm Zilient wire to cross both the proximal and distal calcified right SFA stenosis.  I then exchanged for an NAV 6 which I placed in the right below-knee popliteal artery for distal protection.  Following this I performed  orbital atherectomy with a CSI 2 mm solid bur up 220,000 RPMs performing multiple runs and giving copious amounts of intra-arterial nitroglycerin.  Following this I performed directional atherectomy using a Hawk 1 LS device on the proximal lesion performing multiple circumferential cuts.  I then performed drug-coated balloon angioplasty of the distal lesion with a 5 mm x 120 mm long impact Admiral balloon and of the proximal lesion with a 5 mm x 80 mm long impact Admiral balloon.  The final result was reduction in the distal 99% calcified right SFA stenosis less than 20% residual and less than 20% residual of the proximal 80 to 90% calcified lesion as well.  The NAV 6  was then retrieved and completion angiography was performed.  The Ansell sheath was withdrawn across the bifurcation and exchanged over an 035 versa core wire for a short 7 [place] sheath.  A MYNX closure device was then successfully deployed achieving hemostasis.  The patient received 300 mg of p.o. Plavix.  He was significantly hypertensive and did receive hydralazine 10 mg as well as labetalol 10 mg. Final Impression: Successful CSI/orbital atherectomy of the high-grade calcified distal right SFA stenosis and Hawk 1 directional atherectomy of a high-grade proximal right SFA stenosis using a NAV 6 distal protection.  A MYNX closure device was successfully deployed achieving hemostasis.  The patient will be hydrated, and discharged home today as an outpatient.  We will obtain right lower extremity arterial Doppler studies in our [facility] line office next week and I will see him back 1 to 2 weeks thereafter to discuss staged intervention of the left lower extremity. Quay Burow. MD, [facility] 03/20/2020 12:37 PM   VAS [place] ABI WITH/WO TBI  Result Date: 03/29/2020 LOWER EXTREMITY DOPPLER STUDY Indications: Claudication, peripheral artery disease, and Patient states his              right leg feels much better since procedure. He still has               claudication symptoms in left leg. High Risk         Hypertension, hyperlipidemia, Diabetes, past history of Factors:          smoking.  Vascular Interventions: 03/20/2020 right Diamondback orbital rotational                         atherectomy proximal and distal SFA. Comparison Study: Prior ABI done 11/22/2019 Right .75 Left .33 Performing Technologist: Salvadore Dom RVT  Examination Guidelines: A complete evaluation includes at minimum,  Doppler waveform signals and systolic blood pressure reading at the level of bilateral brachial, anterior tibial, and posterior tibial arteries, when vessel segments are accessible. Bilateral testing is considered an integral part of a complete examination. Photoelectric Plethysmograph (PPG) waveforms and toe systolic pressure readings are included as required and additional duplex testing as needed. Limited examinations for reoccurring indications may be performed as noted.  ABI Findings: +---------+------------------+-----+----------+--------+ Right    Rt Pressure (mmHg)IndexWaveform  Comment  +---------+------------------+-----+----------+--------+ Brachial 145                                       +---------+------------------+-----+----------+--------+ ATA      134               0.89 monophasic         +---------+------------------+-----+----------+--------+ PTA      143               0.95 monophasic         +---------+------------------+-----+----------+--------+ PERO     138               0.91 monophasic         +---------+------------------+-----+----------+--------+ Great Toe88                0.58 Abnormal           +---------+------------------+-----+----------+--------+ +---------+------------------+-----+----------+-------+ Left     Lt Pressure (mmHg)IndexWaveform  Comment +---------+------------------+-----+----------+-------+ Brachial 151                                       +---------+------------------+-----+----------+-------+ ATA      60                0.40 monophasic        +---------+------------------+-----+----------+-------+ PTA      97                0.64 monophasic        +---------+------------------+-----+----------+-------+ PERO     49                0.32 monophasic        +---------+------------------+-----+----------+-------+ Great Toe52                0.34 Abnormal          +---------+------------------+-----+----------+-------+ +-------+-----------+-----------+------------+------------+ ABI/TBIToday's ABIToday's TBIPrevious ABIPrevious TBI +-------+-----------+-----------+------------+------------+ Right  .95        .58        .75         .52          +-------+-----------+-----------+------------+------------+ Left   .64        .34        .60         .38          +-------+-----------+-----------+------------+------------+ Right ABIs and TBIs appear increased compared to prior study on 11/22/2019. Left ABIs and TBIs appear essentially unchanged compared to prior study on 11/22/2019.  Summary: Right: Resting right ankle-brachial index is within normal range. No evidence of significant right lower extremity arterial disease. The right toe-brachial index is abnormal. Left: Resting left ankle-brachial index indicates moderate left lower extremity arterial disease. The left toe-brachial index is abnormal.  *See table(s) above for measurements and observations.  Suggest follow up study in 6 months. Electronically signed by Larae Grooms MD on 03/29/2020  at 12:45:19 PM.    Final    VAS [place] LOWER EXTREMITY ARTERIAL DUPLEX  Result Date: 03/29/2020 LOWER EXTREMITY ARTERIAL DUPLEX STUDY Indications: Claudication, peripheral artery disease, and Patient states his              right leg feels much better since procedure. He still has              claudication symptoms in left leg. High Risk Factors: Hypertension, hyperlipidemia,  Diabetes, past history of                    smoking, coronary artery disease.  Vascular Interventions: 03/20/2020 right Diamondback orbital rotational                         atherectomy proximal and distal right SFA. Current ABI:            Right .95 Left .45 Comparison Study: Prior right leg arterial duplex on 11/22/2019 showed highest                   velocities in right mid SFA 310 cm/s. Performing Technologist: Salvadore Dom RVT, RDCS (AE), RDMS  Examination Guidelines: A complete evaluation includes B-mode imaging, spectral Doppler, color Doppler, and power Doppler as needed of all accessible portions of each vessel. Bilateral testing is considered an integral part of a complete examination. Limited examinations for reoccurring indications may be performed as noted.  +----------+--------+-----+--------+---------+--------+ RIGHT     PSV cm/sRatioStenosisWaveform Comments +----------+--------+-----+--------+---------+--------+ CFA Prox  124                  triphasic         +----------+--------+-----+--------+---------+--------+ CFA Distal124                  biphasic          +----------+--------+-----+--------+---------+--------+ DFA       91                   biphasic          +----------+--------+-----+--------+---------+--------+ SFA Prox  128                  biphasic          +----------+--------+-----+--------+---------+--------+ SFA Mid   147                  biphasic          +----------+--------+-----+--------+---------+--------+ SFA Distal95                   biphasic          +----------+--------+-----+--------+---------+--------+ POP Prox  59                   biphasic          +----------+--------+-----+--------+---------+--------+ POP Distal41                   biphasic          +----------+--------+-----+--------+---------+--------+ TP Trunk  69                   biphasic           +----------+--------+-----+--------+---------+--------+ PTA Prox  52                   biphasic          +----------+--------+-----+--------+---------+--------+  Summary: Right: Marked improvement is noted compared to previous study.  Heavy medial calcifications see throughout right lower extremity. There are segments of shadowing and heterogenous plaque, can not rule out stenosis in these areas. No evidence of stenosis seen today.  See table(s) above for measurements and observations. Suggest follow up study in 6 months. Electronically signed by Larae Grooms MD on 03/29/2020 at 12:46:07 PM.    Final    Disposition   Patient is being discharged home today in good condition.  Follow-up Plans & Appointments    Follow-up Information     [facility] Follow up.   Specialty: Cardiology Why: Appointment for repeat lower extremity ultrasounds scheduled for 04/25/2020 at 9:00am. Contact information: [address] [place] [phone]        Lorretta Harp, MD Follow up.   Specialties: Cardiology, Radiology Why: Follow-up visit scheduled for 05/03/2020 at 9:45am. Contact information: [address] [place] [address] [phone]           Discharge Instructions     Call MD for:  redness, tenderness, or signs of infection (pain, swelling, redness, odor or green/yellow discharge around incision site)   Complete by: As directed    Call MD for:  severe uncontrolled pain   Complete by: As directed    Diet - low sodium heart healthy   Complete by: As directed    Increase activity slowly   Complete by: As directed        Discharge Medications   Allergies as of 04/18/2020       Reactions   Januvia [sitagliptin]    pancreatitis        Medication List     STOP taking these medications    rosuvastatin 40 MG tablet Commonly known as: CRESTOR       TAKE these medications    aspirin 81 MG  tablet Take 81 mg by mouth at bedtime.   atorvastatin 80 MG tablet Commonly known as: LIPITOR Take 40 mg by mouth at bedtime.   clopidogrel 75 MG tablet Commonly known as: Plavix Take 1 tablet (75 mg total) by mouth Delgado.   EUCERIN EX Apply 1 application topically Delgado.   insulin aspart protamine- aspart (70-30) 100 UNIT/ML injection Commonly known as: NOVOLOG MIX 70/30 Inject 70 Units into the skin 2 (two) times Delgado before a meal.   Jardiance 25 MG Tabs tablet Generic drug: empagliflozin Take 12.5 mg by mouth Delgado.   lisinopril 40 MG tablet Commonly known as: ZESTRIL Take 40 mg by mouth at bedtime.   metFORMIN 1000 MG tablet Commonly known as: GLUCOPHAGE Take 1,000 mg by mouth 2 (two) times Delgado with a meal. Notes to patient: Restart 48 hours after your procedure (evening of 04/19/2020).   NON FORMULARY Insulin Pen Needle 31G X 8 MM Miscellaneous as directed   sildenafil 100 MG tablet Commonly known as: VIAGRA Take 100 mg by mouth Delgado as needed for erectile dysfunction.   THERATEARS OP Place 1 drop into both eyes Delgado as needed (dry eyes).   Vitamin D 50 MCG (2000 UT) tablet Take 4,000 Units by mouth Delgado.           Outstanding Labs/Studies   N/A  Duration of Discharge Encounter   Greater than 30 minutes including physician time.  Signed, Darreld Mclean, PA-C 04/18/2020, 8:38 AM  Patient examined chart reviewed. Post PCI popliteal and stent SFA right FA post Mynx closure with no hematoma. Ok for d/c will arrange f/u with Dr Remo Lipps  Johnsie Cancel MD [facility]

## 2020-04-18 NOTE — Discharge Instructions (Signed)
Continue home Plavix and Aspirin. Please check when you get home to see what cholesterol medication you are taking - Atorvastatin (Lipitor) or Rosuvastatin (Crestor) and let us know. You should not be taking both at the same time. On your discharge paperwork, we continued the Atorvastatin and stopped the Crestor because it looks like you told someone on 04/07/2020 that you were no longer taking Rosuvastatin.   Groin Site Care Refer to this sheet in the next few weeks. These instructions provide you with information on caring for yourself after your procedure. Your caregiver may also give you more specific instructions. Your treatment has been planned according to current medical practices, but problems sometimes occur. Call your caregiver if you have any problems or questions after your procedure. HOME CARE INSTRUCTIONS  You may shower 24 hours after the procedure. Remove the bandage (dressing) and gently wash the site with plain soap and water. Gently pat the site dry.   Do not apply powder or lotion to the site.   Do not sit in a bathtub, swimming pool, or whirlpool for 5 to 7 days.   No bending, squatting, or lifting anything over 10 pounds (4.5 kg) as directed by your caregiver.   Inspect the site at least twice daily.   Do not drive home if you are discharged the same day of the procedure. Have someone else drive you.   You may drive 24 hours after the procedure unless otherwise instructed by your caregiver.  What to expect:  Any bruising will usually fade within 1 to 2 weeks.   Blood that collects in the tissue (hematoma) may be painful to the touch. It should usually decrease in size and tenderness within 1 to 2 weeks.  SEEK IMMEDIATE MEDICAL CARE IF:  You have unusual pain at the groin site or down the affected leg.   You have redness, warmth, swelling, or pain at the groin site.   You have drainage (other than a small amount of blood on the dressing).   You have chills.    You have a fever or persistent symptoms for more than 72 hours.   You have a fever and your symptoms suddenly get worse.   Your leg becomes pale, cool, tingly, or numb.   You have heavy bleeding from the site. Hold pressure on the site.

## 2020-04-18 NOTE — Plan of Care (Signed)
Min assist adls

## 2020-04-21 DIAGNOSIS — I2510 Atherosclerotic heart disease of native coronary artery without angina pectoris: Secondary | ICD-10-CM | POA: Diagnosis not present

## 2020-04-21 DIAGNOSIS — N1832 Chronic kidney disease, stage 3b: Secondary | ICD-10-CM | POA: Diagnosis not present

## 2020-04-21 DIAGNOSIS — I129 Hypertensive chronic kidney disease with stage 1 through stage 4 chronic kidney disease, or unspecified chronic kidney disease: Secondary | ICD-10-CM | POA: Diagnosis not present

## 2020-04-21 DIAGNOSIS — E7800 Pure hypercholesterolemia, unspecified: Secondary | ICD-10-CM | POA: Diagnosis not present

## 2020-04-21 DIAGNOSIS — E1165 Type 2 diabetes mellitus with hyperglycemia: Secondary | ICD-10-CM | POA: Diagnosis not present

## 2020-04-21 DIAGNOSIS — E1121 Type 2 diabetes mellitus with diabetic nephropathy: Secondary | ICD-10-CM | POA: Diagnosis not present

## 2020-04-21 DIAGNOSIS — E1151 Type 2 diabetes mellitus with diabetic peripheral angiopathy without gangrene: Secondary | ICD-10-CM | POA: Diagnosis not present

## 2020-04-21 DIAGNOSIS — F329 Major depressive disorder, single episode, unspecified: Secondary | ICD-10-CM | POA: Diagnosis not present

## 2020-04-21 DIAGNOSIS — N183 Chronic kidney disease, stage 3 unspecified: Secondary | ICD-10-CM | POA: Diagnosis not present

## 2020-04-25 ENCOUNTER — Other Ambulatory Visit (HOSPITAL_COMMUNITY): Payer: Self-pay | Admitting: Cardiovascular Disease

## 2020-04-25 ENCOUNTER — Ambulatory Visit (HOSPITAL_COMMUNITY)
Admission: RE | Admit: 2020-04-25 | Discharge: 2020-04-25 | Disposition: A | Discharge status: Home / Self Care | Payer: Medicare PPO | Source: Ambulatory Visit | Attending: Cardiology | Admitting: Cardiology

## 2020-04-25 ENCOUNTER — Other Ambulatory Visit: Payer: Self-pay

## 2020-04-25 DIAGNOSIS — I739 Peripheral vascular disease, unspecified: Secondary | ICD-10-CM | POA: Diagnosis not present

## 2020-04-25 DIAGNOSIS — Z9862 Peripheral vascular angioplasty status: Secondary | ICD-10-CM

## 2020-04-26 ENCOUNTER — Telehealth: Payer: Self-pay

## 2020-04-26 DIAGNOSIS — I739 Peripheral vascular disease, unspecified: Secondary | ICD-10-CM

## 2020-04-26 NOTE — Telephone Encounter (Signed)
Spoke to patient lower ext doppler results given.Advised to repeat in 6 months.

## 2020-04-27 ENCOUNTER — Telehealth: Payer: Self-pay

## 2020-04-27 DIAGNOSIS — I739 Peripheral vascular disease, unspecified: Secondary | ICD-10-CM

## 2020-04-27 NOTE — Telephone Encounter (Signed)
Spoke to patient lower ext doppler results given.Advised to repeat in 6 months.

## 2020-05-03 ENCOUNTER — Ambulatory Visit: Payer: Medicare PPO | Admitting: Cardiovascular Disease

## 2020-05-03 ENCOUNTER — Encounter: Payer: Self-pay | Admitting: Cardiovascular Disease

## 2020-05-03 ENCOUNTER — Other Ambulatory Visit: Payer: Self-pay

## 2020-05-03 VITALS — BP 174/96 | HR 82 | Ht 69.5 in | Wt 256.0 lb

## 2020-05-03 DIAGNOSIS — I10 Essential (primary) hypertension: Secondary | ICD-10-CM

## 2020-05-03 DIAGNOSIS — I739 Peripheral vascular disease, unspecified: Secondary | ICD-10-CM | POA: Diagnosis not present

## 2020-05-03 NOTE — Progress Notes (Signed)
05/03/2020 Kyle Delgado   July 04, 1946  [id number]  Primary Physician Kyle Orn, MD Primary Cardiologist: Kyle Harp MD Kyle Delgado, [place]  HPI:  Kyle Delgado is a 74 y.o.  severely overweight single African-American male with no children who is retired from working at [employer] .He was referred by Kyle Delgado for second opinion for evaluation treatment of PAD.I last saw him in the office  04/07/2020.Marland KitchenHis risk factors include remote tobacco abuse, treated hypertension, diabetes and hyperlipidemia. Is never had a heart attack or stroke. There is no family history of heart disease. He denies chest pain or shortness of breath. He does have obstructive sleep apnea intolerant to CPAP. He had stenting of his LAD March 2012 in [place] and is asymptomatic from this. Over the last year or 2 has had lifestyle limiting claudication with work-up performed at the [facility] thought not to be a candidate for intervention because of CKD 3. He is referred here for second opinion.  He did have Dopplers performed in office 11/14/2019 revealing a right ABI of 0.75 and a left of 0.60. He had a high-frequency signal in his mid right SFA with an occluded anterior tibial, moderate disease in his left SFA with occluded posterior tibial.   I performed peripheral angiography on him 03/20/2020 using a total of 170 cc of contrast.  I demonstrated bilateral SFA and tibial vessel disease.  I performed orbital atherectomy of the high-grade calcified distal right SFA stenosis and directional atherectomy of the proximal lesion using distal protection.  His right lower extremity claudication has resolved and his Dopplers have normalized.  He wishes to proceed with left lower extremity intervention for lifestyle limiting claudication.  I performed left SFA intervention 04/17/2020 with orbital atherectomy of the left popliteal artery with PTA and left SFA with orbital atherectomy and drug-coated  balloon angioplasty.  His ABIs normalized and his claudication has resolved bilaterally.   Current Meds  Medication Sig  . aspirin 81 MG tablet Take 81 mg by mouth at bedtime.   [name] atorvastatin (LIPITOR) 80 MG tablet Take 40 mg by mouth at bedtime.  . Carboxymethylcellulose Sodium (THERATEARS OP) Place 1 drop into both eyes daily as needed (dry eyes).  . Cholecalciferol (VITAMIN D) 50 MCG (2000 UT) tablet Take 4,000 Units by mouth daily.  . clopidogrel (PLAVIX) 75 MG tablet Take 1 tablet (75 mg total) by mouth daily.  . Emollient (EUCERIN EX) Apply 1 application topically daily.   . empagliflozin (JARDIANCE) 25 MG TABS tablet Take 12.5 mg by mouth daily.  . insulin aspart protamine- aspart (NOVOLOG MIX 70/30) (70-30) 100 UNIT/ML injection Inject 70 Units into the skin 2 (two) times daily before a meal.   . lisinopril (PRINIVIL,ZESTRIL) 40 MG tablet Take 40 mg by mouth at bedtime.   . metFORMIN (GLUCOPHAGE) 1000 MG tablet Take 1,000 mg by mouth 2 (two) times daily with a meal.  . NON FORMULARY Insulin Pen Needle 31G X 8 MM Miscellaneous as directed  . sildenafil (VIAGRA) 100 MG tablet Take 100 mg by mouth daily as needed for erectile dysfunction.     Allergies  Allergen Reactions  . Januvia [Sitagliptin]     pancreatitis    Social History   Socioeconomic History  . Marital status: Single    Spouse name: Not on file  . Number of children: Not on file  . Years of education: Not on file  . Highest education level: Not on file  Occupational History  .  Not on file  Tobacco Use  . Smoking status: Former Smoker    Packs/day: 1.00    Types: Cigarettes    Quit date: 2008    Years since quitting: 13.4  . Smokeless tobacco: Never Used  Substance and Sexual Activity  . Alcohol use: No  . Drug use: No  . Sexual activity: Not on file  Other Topics Concern  . Not on file  Social History Narrative  . Not on file   Social Determinants of Health   Financial Resource Strain:   .  Difficulty of Paying Living Expenses:   Food Insecurity:   . Worried About Charity fundraiser in the Last Year:   . Arboriculturist in the Last Year:   Transportation Needs:   . Film/video editor (Medical):   [name] Lack of Transportation (Non-Medical):   Physical Activity:   . Days of Exercise per Week:   . Minutes of Exercise per Session:   Stress:   . Feeling of Stress :   Social Connections:   . Frequency of Communication with Friends and Family:   . Frequency of Social Gatherings with Friends and Family:   . Attends Religious Services:   . Active Member of Clubs or Organizations:   . Attends Archivist Meetings:   [name] Marital Status:   Intimate Partner Violence:   . Fear of Current or Ex-Partner:   . Emotionally Abused:   [name] Physically Abused:   . Sexually Abused:      Review of Systems: General: negative for chills, fever, night sweats or weight changes.  Cardiovascular: negative for chest pain, dyspnea on exertion, edema, orthopnea, palpitations, paroxysmal nocturnal dyspnea or shortness of breath Dermatological: negative for rash Respiratory: negative for cough or wheezing Urologic: negative for hematuria Abdominal: negative for nausea, vomiting, diarrhea, bright red blood per rectum, melena, or hematemesis Neurologic: negative for visual changes, syncope, or dizziness All other systems reviewed and are otherwise negative except as noted above.    Blood pressure (!) 174/96, pulse 82, height 5' 9.5" (1.765 m), weight 256 lb (116.1 kg), SpO2 93 %.  General appearance: alert and no distress Neck: no adenopathy, no carotid bruit, no JVD, supple, symmetrical, trachea midline and thyroid not enlarged, symmetric, no tenderness/mass/nodules Lungs: clear to auscultation bilaterally Heart: regular rate and rhythm, S1, S2 normal, no murmur, click, rub or gallop Extremities: extremities normal, atraumatic, no cyanosis or edema Pulses: 2+ and symmetric Skin: Skin  color, texture, turgor normal. No rashes or lesions Neurologic: Alert and oriented X 3, normal strength and tone. Normal symmetric reflexes. Normal coordination and gait  EKG not performed today  ASSESSMENT AND PLAN:   Essential hypertension History of essential hypertension a blood pressure measured today 174/96.  He says he has been out of his medications for the last 2 days which is lisinopril.  Hyperlipidemia History of hyperlipidemia on high-dose statin therapy with lipid profile performed 12/22/2019 revealing total cholesterol 108, LDL 59 HDL 33.  Peripheral arterial disease (HCC) History of PAD status post staged intervention of his right SFA 03/20/2020 with orbital atherectomy of his calcified distal right SFA and directional atherectomy of his proximal.  He had staged left SFA and popliteal intervention 04/17/2020.  His ABIs normalized and his claudication has resolved.  He is on aspirin and Plavix.      Kyle Harp MD FACP,FACC,FAHA, [facility] 05/03/2020 10:06 AM

## 2020-05-03 NOTE — Assessment & Plan Note (Signed)
History of essential hypertension a blood pressure measured today 174/96.  He says he has been out of his medications for the last 2 days which is lisinopril.

## 2020-05-03 NOTE — Patient Instructions (Addendum)
Medication Instructions:  The current medical regimen is effective;  continue present plan and medications.  *If you need a refill on your cardiac medications before your next appointment, please call your pharmacy*   Testing/Procedures: Your physician has requested that you have a lower or upper extremity arterial duplex (6 months) . This test is an ultrasound of the arteries in the legs or arms. It looks at arterial blood flow in the legs and arms. Allow one hour for Lower and Upper Arterial scans. There are no restrictions or special instructions   Follow-Up: At [facility], you and your health needs are our priority.  As part of our continuing mission to provide you with exceptional heart care, we have created designated Provider Care Teams.  These Care Teams include your primary Cardiologist (physician) and Advanced Practice Providers (APPs -  Physician Assistants and Nurse Practitioners) who all work together to provide you with the care you need, when you need it.  We recommend signing up for the patient portal called "MyChart".  Sign up information is provided on this After Visit Summary.  MyChart is used to connect with patients for Virtual Visits (Telemedicine).  Patients are able to view lab/test results, encounter notes, upcoming appointments, etc.  Non-urgent messages can be sent to your provider as well.   To learn more about what you can do with MyChart, go to [email or web address].    Your next appointment:   6 month(s)  The format for your next appointment:   In Person  Provider:   Quay Burow, MD

## 2020-05-03 NOTE — Assessment & Plan Note (Signed)
History of PAD status post staged intervention of his right SFA 03/20/2020 with orbital atherectomy of his calcified distal right SFA and directional atherectomy of his proximal.  He had staged left SFA and popliteal intervention 04/17/2020.  His ABIs normalized and his claudication has resolved.  He is on aspirin and Plavix.

## 2020-05-03 NOTE — Assessment & Plan Note (Signed)
History of hyperlipidemia on high-dose statin therapy with lipid profile performed 12/22/2019 revealing total cholesterol 108, LDL 59 HDL 33.

## 2020-06-21 DIAGNOSIS — Z6837 Body mass index (BMI) 37.0-37.9, adult: Secondary | ICD-10-CM | POA: Diagnosis not present

## 2020-06-21 DIAGNOSIS — I739 Peripheral vascular disease, unspecified: Secondary | ICD-10-CM | POA: Diagnosis not present

## 2020-06-21 DIAGNOSIS — Z794 Long term (current) use of insulin: Secondary | ICD-10-CM | POA: Diagnosis not present

## 2020-06-21 DIAGNOSIS — N1832 Chronic kidney disease, stage 3b: Secondary | ICD-10-CM | POA: Diagnosis not present

## 2020-06-21 DIAGNOSIS — Z0000 Encounter for general adult medical examination without abnormal findings: Secondary | ICD-10-CM | POA: Diagnosis not present

## 2020-06-21 DIAGNOSIS — Z1389 Encounter for screening for other disorder: Secondary | ICD-10-CM | POA: Diagnosis not present

## 2020-06-21 DIAGNOSIS — I129 Hypertensive chronic kidney disease with stage 1 through stage 4 chronic kidney disease, or unspecified chronic kidney disease: Secondary | ICD-10-CM | POA: Diagnosis not present

## 2020-06-21 DIAGNOSIS — E7800 Pure hypercholesterolemia, unspecified: Secondary | ICD-10-CM | POA: Diagnosis not present

## 2020-06-21 DIAGNOSIS — E1151 Type 2 diabetes mellitus with diabetic peripheral angiopathy without gangrene: Secondary | ICD-10-CM | POA: Diagnosis not present

## 2020-06-21 DIAGNOSIS — E1121 Type 2 diabetes mellitus with diabetic nephropathy: Secondary | ICD-10-CM | POA: Diagnosis not present

## 2020-08-09 DIAGNOSIS — F329 Major depressive disorder, single episode, unspecified: Secondary | ICD-10-CM | POA: Diagnosis not present

## 2020-08-09 DIAGNOSIS — E1121 Type 2 diabetes mellitus with diabetic nephropathy: Secondary | ICD-10-CM | POA: Diagnosis not present

## 2020-08-09 DIAGNOSIS — N183 Chronic kidney disease, stage 3 unspecified: Secondary | ICD-10-CM | POA: Diagnosis not present

## 2020-08-09 DIAGNOSIS — E1165 Type 2 diabetes mellitus with hyperglycemia: Secondary | ICD-10-CM | POA: Diagnosis not present

## 2020-08-09 DIAGNOSIS — I129 Hypertensive chronic kidney disease with stage 1 through stage 4 chronic kidney disease, or unspecified chronic kidney disease: Secondary | ICD-10-CM | POA: Diagnosis not present

## 2020-08-09 DIAGNOSIS — N1832 Chronic kidney disease, stage 3b: Secondary | ICD-10-CM | POA: Diagnosis not present

## 2020-08-09 DIAGNOSIS — E1151 Type 2 diabetes mellitus with diabetic peripheral angiopathy without gangrene: Secondary | ICD-10-CM | POA: Diagnosis not present

## 2020-08-09 DIAGNOSIS — I2510 Atherosclerotic heart disease of native coronary artery without angina pectoris: Secondary | ICD-10-CM | POA: Diagnosis not present

## 2020-08-09 DIAGNOSIS — E7800 Pure hypercholesterolemia, unspecified: Secondary | ICD-10-CM | POA: Diagnosis not present

## 2020-09-21 DIAGNOSIS — E1165 Type 2 diabetes mellitus with hyperglycemia: Secondary | ICD-10-CM | POA: Diagnosis not present

## 2020-09-21 DIAGNOSIS — F329 Major depressive disorder, single episode, unspecified: Secondary | ICD-10-CM | POA: Diagnosis not present

## 2020-09-21 DIAGNOSIS — E1151 Type 2 diabetes mellitus with diabetic peripheral angiopathy without gangrene: Secondary | ICD-10-CM | POA: Diagnosis not present

## 2020-09-21 DIAGNOSIS — E7800 Pure hypercholesterolemia, unspecified: Secondary | ICD-10-CM | POA: Diagnosis not present

## 2020-09-21 DIAGNOSIS — N183 Chronic kidney disease, stage 3 unspecified: Secondary | ICD-10-CM | POA: Diagnosis not present

## 2020-09-21 DIAGNOSIS — I129 Hypertensive chronic kidney disease with stage 1 through stage 4 chronic kidney disease, or unspecified chronic kidney disease: Secondary | ICD-10-CM | POA: Diagnosis not present

## 2020-09-21 DIAGNOSIS — I2510 Atherosclerotic heart disease of native coronary artery without angina pectoris: Secondary | ICD-10-CM | POA: Diagnosis not present

## 2020-09-21 DIAGNOSIS — N1832 Chronic kidney disease, stage 3b: Secondary | ICD-10-CM | POA: Diagnosis not present

## 2020-09-21 DIAGNOSIS — E1121 Type 2 diabetes mellitus with diabetic nephropathy: Secondary | ICD-10-CM | POA: Diagnosis not present

## 2020-10-03 DIAGNOSIS — E1151 Type 2 diabetes mellitus with diabetic peripheral angiopathy without gangrene: Secondary | ICD-10-CM | POA: Diagnosis not present

## 2020-10-03 DIAGNOSIS — E7800 Pure hypercholesterolemia, unspecified: Secondary | ICD-10-CM | POA: Diagnosis not present

## 2020-10-03 DIAGNOSIS — F329 Major depressive disorder, single episode, unspecified: Secondary | ICD-10-CM | POA: Diagnosis not present

## 2020-10-03 DIAGNOSIS — I129 Hypertensive chronic kidney disease with stage 1 through stage 4 chronic kidney disease, or unspecified chronic kidney disease: Secondary | ICD-10-CM | POA: Diagnosis not present

## 2020-10-03 DIAGNOSIS — N183 Chronic kidney disease, stage 3 unspecified: Secondary | ICD-10-CM | POA: Diagnosis not present

## 2020-10-03 DIAGNOSIS — E1121 Type 2 diabetes mellitus with diabetic nephropathy: Secondary | ICD-10-CM | POA: Diagnosis not present

## 2020-10-03 DIAGNOSIS — N1832 Chronic kidney disease, stage 3b: Secondary | ICD-10-CM | POA: Diagnosis not present

## 2020-10-03 DIAGNOSIS — E1165 Type 2 diabetes mellitus with hyperglycemia: Secondary | ICD-10-CM | POA: Diagnosis not present

## 2020-10-03 DIAGNOSIS — I2510 Atherosclerotic heart disease of native coronary artery without angina pectoris: Secondary | ICD-10-CM | POA: Diagnosis not present

## 2020-10-24 DIAGNOSIS — E1121 Type 2 diabetes mellitus with diabetic nephropathy: Secondary | ICD-10-CM | POA: Diagnosis not present

## 2020-10-24 DIAGNOSIS — R1013 Epigastric pain: Secondary | ICD-10-CM | POA: Diagnosis not present

## 2020-10-24 DIAGNOSIS — I739 Peripheral vascular disease, unspecified: Secondary | ICD-10-CM | POA: Diagnosis not present

## 2020-10-24 DIAGNOSIS — I10 Essential (primary) hypertension: Secondary | ICD-10-CM | POA: Diagnosis not present

## 2020-10-27 ENCOUNTER — Other Ambulatory Visit: Payer: Self-pay | Admitting: Cardiovascular Disease

## 2020-10-27 DIAGNOSIS — I739 Peripheral vascular disease, unspecified: Secondary | ICD-10-CM

## 2020-11-02 ENCOUNTER — Ambulatory Visit (HOSPITAL_COMMUNITY)
Admission: RE | Admit: 2020-11-02 | Discharge: 2020-11-02 | Disposition: A | Discharge status: Home / Self Care | Payer: Medicare PPO | Source: Ambulatory Visit | Attending: Cardiology | Admitting: Cardiology

## 2020-11-02 ENCOUNTER — Other Ambulatory Visit: Payer: Self-pay | Admitting: Cardiovascular Disease

## 2020-11-02 ENCOUNTER — Other Ambulatory Visit: Payer: Self-pay

## 2020-11-02 DIAGNOSIS — I739 Peripheral vascular disease, unspecified: Secondary | ICD-10-CM | POA: Diagnosis not present

## 2020-11-07 ENCOUNTER — Encounter: Payer: Self-pay | Admitting: Cardiovascular Disease

## 2020-11-07 ENCOUNTER — Other Ambulatory Visit: Payer: Self-pay

## 2020-11-07 ENCOUNTER — Ambulatory Visit: Payer: Medicare PPO | Admitting: Cardiovascular Disease

## 2020-11-07 DIAGNOSIS — I2510 Atherosclerotic heart disease of native coronary artery without angina pectoris: Secondary | ICD-10-CM

## 2020-11-07 DIAGNOSIS — I10 Essential (primary) hypertension: Secondary | ICD-10-CM | POA: Diagnosis not present

## 2020-11-07 DIAGNOSIS — I739 Peripheral vascular disease, unspecified: Secondary | ICD-10-CM

## 2020-11-07 DIAGNOSIS — E782 Mixed hyperlipidemia: Secondary | ICD-10-CM | POA: Diagnosis not present

## 2020-11-07 NOTE — Assessment & Plan Note (Signed)
History of peripheral arterial disease with bilateral lower extremity claudication status post staged right followed by left SFA intervention in April and May of this year with follow-up Dopplers that showed normalization.  His claudication has resolved.  He is on aspirin.  We will recheck lower extremity arterial Dopplers in 12 months.

## 2020-11-07 NOTE — Patient Instructions (Signed)
Medication Instructions:  Your physician recommends that you continue on your current medications as directed. Please refer to the Current Medication list given to you today.  *If you need a refill on your cardiac medications before your next appointment, please call your pharmacy*   Testing/Procedures: Your physician has requested that you have a lowerextremity arterial duplex. This test is an ultrasound of the arteries in the legs. It looks at arterial blood flow in the legs and arms. Allow one hour for Lower  Arterial scans. There are no restrictions or special instructions. To Be done Nov-Dec 2022     Follow-Up: At [facility], you and your health needs are our priority.  As part of our continuing mission to provide you with exceptional heart care, we have created designated Provider Care Teams.  These Care Teams include your primary Cardiologist (physician) and Advanced Practice Providers (APPs -  Physician Assistants and Nurse Practitioners) who all work together to provide you with the care you need, when you need it.  We recommend signing up for the patient portal called "MyChart".  Sign up information is provided on this After Visit Summary.  MyChart is used to connect with patients for Virtual Visits (Telemedicine).  Patients are able to view lab/test results, encounter notes, upcoming appointments, etc.  Non-urgent messages can be sent to your provider as well.   To learn more about what you can do with MyChart, go to [email or web address].    Your next appointment:   12 month(s)  The format for your next appointment:   In Person  Provider:   Quay Burow, MD

## 2020-11-07 NOTE — Assessment & Plan Note (Signed)
History of essential hypertension a blood pressure measured today 128/74.  He is on lisinopril.

## 2020-11-07 NOTE — Progress Notes (Signed)
11/07/2020 Kyle Delgado   04-Sep-1946  [id number]  Primary Physician Lavone Orn, MD Primary Cardiologist: Lorretta Harp MD [name], [place]  HPI:  Kyle Delgado is a 74 y.o.  severely overweight single African-American male with no children who is retired from working at [employer] .He was referred by Dr. Laurann Montana for second opinion for evaluation treatment of PAD.I last saw him in the office  05/03/2020.His risk factors include remote tobacco abuse, treated hypertension, diabetes and hyperlipidemia. Is never had a heart attack or stroke. There is no family history of heart disease. He denies chest pain or shortness of breath. He does have obstructive sleep apnea intolerant to CPAP. He had stenting of his LAD March 2012 in [place] and is asymptomatic from this. Over the last year or 2 has had lifestyle limiting claudication with work-up performed at the [facility] thought not to be a candidate for intervention because of CKD 3. He is referred here for second opinion.  He did have Dopplers performed in office 11/14/2019 revealing a right ABI of 0.75 and a left of 0.60. He had a high-frequency signal in his mid right SFA with an occluded anterior tibial, moderate disease in his left SFA with occluded posterior tibial.  I performed peripheral angiography on him 03/20/2020 using a total of 170 cc of contrast. I demonstrated bilateral SFA and tibial vessel disease. I performed orbital atherectomy of the high-grade calcified distal right SFA stenosis and directional atherectomy of the proximal lesion using distal protection. His right lower extremity claudication has resolved and his Dopplers have normalized. He wishes to proceed with left lower extremity intervention for lifestyle limiting claudication.  I performed left SFA intervention 04/17/2020 with orbital atherectomy of the left popliteal artery with PTA and left SFA with orbital atherectomy and drug-coated  balloon angioplasty.  His ABIs normalized and his claudication has resolved bilaterally.  Since I saw him 6 months ago he continues to do well.  He stopped smoking several years ago.  He no longer has claudication.  He denies chest pain or shortness of breath.  His most recent Doppler studies performed 11/02/2020 revealed a widely patent left SFA.   Current Meds  Medication Sig  . aspirin 81 MG tablet Take 81 mg by mouth at bedtime.   [name] atorvastatin (LIPITOR) 80 MG tablet Take 40 mg by mouth at bedtime.  . Carboxymethylcellulose Sodium (THERATEARS OP) Place 1 drop into both eyes daily as needed (dry eyes).  . Cholecalciferol (VITAMIN D) 50 MCG (2000 UT) tablet Take 4,000 Units by mouth daily.  . Emollient (EUCERIN EX) Apply 1 application topically daily.   . empagliflozin (JARDIANCE) 25 MG TABS tablet Take 12.5 mg by mouth daily.  . insulin aspart protamine- aspart (NOVOLOG MIX 70/30) (70-30) 100 UNIT/ML injection Inject 70 Units into the skin 2 (two) times daily before a meal.   . lisinopril (PRINIVIL,ZESTRIL) 40 MG tablet Take 40 mg by mouth at bedtime.   . metFORMIN (GLUCOPHAGE) 1000 MG tablet Take 1,000 mg by mouth 2 (two) times daily with a meal.  . NON FORMULARY Insulin Pen Needle 31G X 8 MM Miscellaneous as directed  . sildenafil (VIAGRA) 100 MG tablet Take 100 mg by mouth daily as needed for erectile dysfunction.     Allergies  Allergen Reactions  . Januvia [Sitagliptin]     pancreatitis    Social History   Socioeconomic History  . Marital status: Single    Spouse name: Not on file  .  Number of children: Not on file  . Years of education: Not on file  . Highest education level: Not on file  Occupational History  . Not on file  Tobacco Use  . Smoking status: Former Smoker    Packs/day: 1.00    Types: Cigarettes    Quit date: 2008    Years since quitting: 13.9  . Smokeless tobacco: Never Used  Substance and Sexual Activity  . Alcohol use: No  . Drug use: No  .  Sexual activity: Not on file  Other Topics Concern  . Not on file  Social History Narrative  . Not on file   Social Determinants of Health   Financial Resource Strain: Not on file  Food Insecurity: Not on file  Transportation Needs: Not on file  Physical Activity: Not on file  Stress: Not on file  Social Connections: Not on file  Intimate Partner Violence: Not on file     Review of Systems: General: negative for chills, fever, night sweats or weight changes.  Cardiovascular: negative for chest pain, dyspnea on exertion, edema, orthopnea, palpitations, paroxysmal nocturnal dyspnea or shortness of breath Dermatological: negative for rash Respiratory: negative for cough or wheezing Urologic: negative for hematuria Abdominal: negative for nausea, vomiting, diarrhea, bright red blood per rectum, melena, or hematemesis Neurologic: negative for visual changes, syncope, or dizziness All other systems reviewed and are otherwise negative except as noted above.    Blood pressure 128/74, pulse 68, height 5\' 9"  (1.753 m), weight 253 lb 9.6 oz (115 kg), SpO2 95 %.  General appearance: alert and no distress Neck: no adenopathy, no carotid bruit, no JVD, supple, symmetrical, trachea midline and thyroid not enlarged, symmetric, no tenderness/mass/nodules Lungs: clear to auscultation bilaterally Heart: regular rate and rhythm, S1, S2 normal, no murmur, click, rub or gallop Extremities: extremities normal, atraumatic, no cyanosis or edema Pulses: 2+ and symmetric Skin: Skin color, texture, turgor normal. No rashes or lesions Neurologic: Alert and oriented X 3, normal strength and tone. Normal symmetric reflexes. Normal coordination and gait  EKG sinus rhythm at 68 with nonspecific ST and T wave changes.  Personally reviewed this EKG.  ASSESSMENT AND PLAN:   Essential hypertension History of essential hypertension a blood pressure measured today 128/74.  He is on  lisinopril.  Hyperlipidemia History of hyperlipidemia on statin therapy with lipid profile performed 06/21/2020 revealing total cholesterol of 125, LDL 75 and HDL 33.  Coronary artery disease History of CAD status post LAD stenting in [place] March 2012.  He is asymptomatic from this.  Peripheral arterial disease (HCC) History of peripheral arterial disease with bilateral lower extremity claudication status post staged right followed by left SFA intervention in April and May of this year with follow-up Dopplers that showed normalization.  His claudication has resolved.  He is on aspirin.  We will recheck lower extremity arterial Dopplers in 12 months.      Lorretta Harp MD FACP,FACC,FAHA, [facility] 11/07/2020 9:48 AM

## 2020-11-07 NOTE — Assessment & Plan Note (Signed)
History of hyperlipidemia on statin therapy with lipid profile performed 06/21/2020 revealing total cholesterol of 125, LDL 75 and HDL 33.

## 2020-11-07 NOTE — Assessment & Plan Note (Signed)
History of CAD status post LAD stenting in [place] March 2012.  He is asymptomatic from this.

## 2020-11-21 DIAGNOSIS — E1151 Type 2 diabetes mellitus with diabetic peripheral angiopathy without gangrene: Secondary | ICD-10-CM | POA: Diagnosis not present

## 2020-11-21 DIAGNOSIS — E1121 Type 2 diabetes mellitus with diabetic nephropathy: Secondary | ICD-10-CM | POA: Diagnosis not present

## 2020-11-21 DIAGNOSIS — I129 Hypertensive chronic kidney disease with stage 1 through stage 4 chronic kidney disease, or unspecified chronic kidney disease: Secondary | ICD-10-CM | POA: Diagnosis not present

## 2020-11-21 DIAGNOSIS — E1165 Type 2 diabetes mellitus with hyperglycemia: Secondary | ICD-10-CM | POA: Diagnosis not present

## 2020-11-21 DIAGNOSIS — I10 Essential (primary) hypertension: Secondary | ICD-10-CM | POA: Diagnosis not present

## 2020-11-21 DIAGNOSIS — E7800 Pure hypercholesterolemia, unspecified: Secondary | ICD-10-CM | POA: Diagnosis not present

## 2020-11-21 DIAGNOSIS — F329 Major depressive disorder, single episode, unspecified: Secondary | ICD-10-CM | POA: Diagnosis not present

## 2020-11-21 DIAGNOSIS — I2510 Atherosclerotic heart disease of native coronary artery without angina pectoris: Secondary | ICD-10-CM | POA: Diagnosis not present

## 2020-12-01 DIAGNOSIS — F329 Major depressive disorder, single episode, unspecified: Secondary | ICD-10-CM | POA: Diagnosis not present

## 2020-12-01 DIAGNOSIS — I2510 Atherosclerotic heart disease of native coronary artery without angina pectoris: Secondary | ICD-10-CM | POA: Diagnosis not present

## 2020-12-01 DIAGNOSIS — E1165 Type 2 diabetes mellitus with hyperglycemia: Secondary | ICD-10-CM | POA: Diagnosis not present

## 2020-12-01 DIAGNOSIS — I129 Hypertensive chronic kidney disease with stage 1 through stage 4 chronic kidney disease, or unspecified chronic kidney disease: Secondary | ICD-10-CM | POA: Diagnosis not present

## 2020-12-01 DIAGNOSIS — I10 Essential (primary) hypertension: Secondary | ICD-10-CM | POA: Diagnosis not present

## 2020-12-01 DIAGNOSIS — E7800 Pure hypercholesterolemia, unspecified: Secondary | ICD-10-CM | POA: Diagnosis not present

## 2020-12-01 DIAGNOSIS — E1121 Type 2 diabetes mellitus with diabetic nephropathy: Secondary | ICD-10-CM | POA: Diagnosis not present

## 2020-12-01 DIAGNOSIS — E1151 Type 2 diabetes mellitus with diabetic peripheral angiopathy without gangrene: Secondary | ICD-10-CM | POA: Diagnosis not present

## 2021-01-19 DIAGNOSIS — I739 Peripheral vascular disease, unspecified: Secondary | ICD-10-CM | POA: Diagnosis not present

## 2021-01-19 DIAGNOSIS — M25552 Pain in left hip: Secondary | ICD-10-CM | POA: Diagnosis not present

## 2021-01-19 DIAGNOSIS — I129 Hypertensive chronic kidney disease with stage 1 through stage 4 chronic kidney disease, or unspecified chronic kidney disease: Secondary | ICD-10-CM | POA: Diagnosis not present

## 2021-01-19 DIAGNOSIS — E1151 Type 2 diabetes mellitus with diabetic peripheral angiopathy without gangrene: Secondary | ICD-10-CM | POA: Diagnosis not present

## 2021-01-19 DIAGNOSIS — E1121 Type 2 diabetes mellitus with diabetic nephropathy: Secondary | ICD-10-CM | POA: Diagnosis not present

## 2021-01-19 DIAGNOSIS — Z794 Long term (current) use of insulin: Secondary | ICD-10-CM | POA: Diagnosis not present

## 2021-01-19 DIAGNOSIS — M25551 Pain in right hip: Secondary | ICD-10-CM | POA: Diagnosis not present

## 2021-01-19 DIAGNOSIS — N1832 Chronic kidney disease, stage 3b: Secondary | ICD-10-CM | POA: Diagnosis not present

## 2021-01-22 DIAGNOSIS — F329 Major depressive disorder, single episode, unspecified: Secondary | ICD-10-CM | POA: Diagnosis not present

## 2021-01-22 DIAGNOSIS — N1832 Chronic kidney disease, stage 3b: Secondary | ICD-10-CM | POA: Diagnosis not present

## 2021-01-22 DIAGNOSIS — I2510 Atherosclerotic heart disease of native coronary artery without angina pectoris: Secondary | ICD-10-CM | POA: Diagnosis not present

## 2021-01-22 DIAGNOSIS — E1151 Type 2 diabetes mellitus with diabetic peripheral angiopathy without gangrene: Secondary | ICD-10-CM | POA: Diagnosis not present

## 2021-01-22 DIAGNOSIS — I129 Hypertensive chronic kidney disease with stage 1 through stage 4 chronic kidney disease, or unspecified chronic kidney disease: Secondary | ICD-10-CM | POA: Diagnosis not present

## 2021-01-22 DIAGNOSIS — E7800 Pure hypercholesterolemia, unspecified: Secondary | ICD-10-CM | POA: Diagnosis not present

## 2021-01-22 DIAGNOSIS — E1165 Type 2 diabetes mellitus with hyperglycemia: Secondary | ICD-10-CM | POA: Diagnosis not present

## 2021-01-22 DIAGNOSIS — E1121 Type 2 diabetes mellitus with diabetic nephropathy: Secondary | ICD-10-CM | POA: Diagnosis not present

## 2021-01-22 DIAGNOSIS — N183 Chronic kidney disease, stage 3 unspecified: Secondary | ICD-10-CM | POA: Diagnosis not present

## 2021-01-23 DIAGNOSIS — E7800 Pure hypercholesterolemia, unspecified: Secondary | ICD-10-CM | POA: Diagnosis not present

## 2021-01-23 DIAGNOSIS — E1165 Type 2 diabetes mellitus with hyperglycemia: Secondary | ICD-10-CM | POA: Diagnosis not present

## 2021-01-23 DIAGNOSIS — I2510 Atherosclerotic heart disease of native coronary artery without angina pectoris: Secondary | ICD-10-CM | POA: Diagnosis not present

## 2021-01-23 DIAGNOSIS — E1121 Type 2 diabetes mellitus with diabetic nephropathy: Secondary | ICD-10-CM | POA: Diagnosis not present

## 2021-01-23 DIAGNOSIS — I129 Hypertensive chronic kidney disease with stage 1 through stage 4 chronic kidney disease, or unspecified chronic kidney disease: Secondary | ICD-10-CM | POA: Diagnosis not present

## 2021-01-23 DIAGNOSIS — E1151 Type 2 diabetes mellitus with diabetic peripheral angiopathy without gangrene: Secondary | ICD-10-CM | POA: Diagnosis not present

## 2021-01-23 DIAGNOSIS — I10 Essential (primary) hypertension: Secondary | ICD-10-CM | POA: Diagnosis not present

## 2021-01-23 DIAGNOSIS — F329 Major depressive disorder, single episode, unspecified: Secondary | ICD-10-CM | POA: Diagnosis not present

## 2021-03-09 DIAGNOSIS — N183 Chronic kidney disease, stage 3 unspecified: Secondary | ICD-10-CM | POA: Diagnosis not present

## 2021-03-09 DIAGNOSIS — E1151 Type 2 diabetes mellitus with diabetic peripheral angiopathy without gangrene: Secondary | ICD-10-CM | POA: Diagnosis not present

## 2021-03-09 DIAGNOSIS — I129 Hypertensive chronic kidney disease with stage 1 through stage 4 chronic kidney disease, or unspecified chronic kidney disease: Secondary | ICD-10-CM | POA: Diagnosis not present

## 2021-03-09 DIAGNOSIS — E7800 Pure hypercholesterolemia, unspecified: Secondary | ICD-10-CM | POA: Diagnosis not present

## 2021-03-09 DIAGNOSIS — E1121 Type 2 diabetes mellitus with diabetic nephropathy: Secondary | ICD-10-CM | POA: Diagnosis not present

## 2021-03-09 DIAGNOSIS — E1165 Type 2 diabetes mellitus with hyperglycemia: Secondary | ICD-10-CM | POA: Diagnosis not present

## 2021-03-09 DIAGNOSIS — I2510 Atherosclerotic heart disease of native coronary artery without angina pectoris: Secondary | ICD-10-CM | POA: Diagnosis not present

## 2021-03-09 DIAGNOSIS — F329 Major depressive disorder, single episode, unspecified: Secondary | ICD-10-CM | POA: Diagnosis not present

## 2021-03-09 DIAGNOSIS — N1832 Chronic kidney disease, stage 3b: Secondary | ICD-10-CM | POA: Diagnosis not present

## 2021-04-16 DIAGNOSIS — I10 Essential (primary) hypertension: Secondary | ICD-10-CM | POA: Diagnosis not present

## 2021-04-16 DIAGNOSIS — E1121 Type 2 diabetes mellitus with diabetic nephropathy: Secondary | ICD-10-CM | POA: Diagnosis not present

## 2021-04-16 DIAGNOSIS — E1165 Type 2 diabetes mellitus with hyperglycemia: Secondary | ICD-10-CM | POA: Diagnosis not present

## 2021-04-16 DIAGNOSIS — I2510 Atherosclerotic heart disease of native coronary artery without angina pectoris: Secondary | ICD-10-CM | POA: Diagnosis not present

## 2021-04-16 DIAGNOSIS — E7800 Pure hypercholesterolemia, unspecified: Secondary | ICD-10-CM | POA: Diagnosis not present

## 2021-04-16 DIAGNOSIS — I129 Hypertensive chronic kidney disease with stage 1 through stage 4 chronic kidney disease, or unspecified chronic kidney disease: Secondary | ICD-10-CM | POA: Diagnosis not present

## 2021-04-16 DIAGNOSIS — E1151 Type 2 diabetes mellitus with diabetic peripheral angiopathy without gangrene: Secondary | ICD-10-CM | POA: Diagnosis not present

## 2021-04-16 DIAGNOSIS — F329 Major depressive disorder, single episode, unspecified: Secondary | ICD-10-CM | POA: Diagnosis not present

## 2021-04-19 ENCOUNTER — Ambulatory Visit
Admission: RE | Admit: 2021-04-19 | Discharge: 2021-04-19 | Disposition: A | Discharge status: Home / Self Care | Payer: Medicare PPO | Source: Ambulatory Visit | Attending: Internal Medicine | Admitting: Internal Medicine

## 2021-04-19 ENCOUNTER — Other Ambulatory Visit: Payer: Self-pay | Admitting: Internal Medicine

## 2021-04-19 DIAGNOSIS — Z794 Long term (current) use of insulin: Secondary | ICD-10-CM | POA: Diagnosis not present

## 2021-04-19 DIAGNOSIS — I129 Hypertensive chronic kidney disease with stage 1 through stage 4 chronic kidney disease, or unspecified chronic kidney disease: Secondary | ICD-10-CM | POA: Diagnosis not present

## 2021-04-19 DIAGNOSIS — E1151 Type 2 diabetes mellitus with diabetic peripheral angiopathy without gangrene: Secondary | ICD-10-CM | POA: Diagnosis not present

## 2021-04-19 DIAGNOSIS — M25552 Pain in left hip: Secondary | ICD-10-CM

## 2021-04-19 DIAGNOSIS — M25551 Pain in right hip: Secondary | ICD-10-CM | POA: Diagnosis not present

## 2021-04-19 DIAGNOSIS — E1121 Type 2 diabetes mellitus with diabetic nephropathy: Secondary | ICD-10-CM | POA: Diagnosis not present

## 2021-04-19 DIAGNOSIS — Z7984 Long term (current) use of oral hypoglycemic drugs: Secondary | ICD-10-CM | POA: Diagnosis not present

## 2021-04-19 DIAGNOSIS — M160 Bilateral primary osteoarthritis of hip: Secondary | ICD-10-CM | POA: Diagnosis not present

## 2021-04-19 DIAGNOSIS — E1165 Type 2 diabetes mellitus with hyperglycemia: Secondary | ICD-10-CM | POA: Diagnosis not present

## 2021-05-16 DIAGNOSIS — E1165 Type 2 diabetes mellitus with hyperglycemia: Secondary | ICD-10-CM | POA: Diagnosis not present

## 2021-05-16 DIAGNOSIS — I2510 Atherosclerotic heart disease of native coronary artery without angina pectoris: Secondary | ICD-10-CM | POA: Diagnosis not present

## 2021-05-16 DIAGNOSIS — F329 Major depressive disorder, single episode, unspecified: Secondary | ICD-10-CM | POA: Diagnosis not present

## 2021-05-16 DIAGNOSIS — E1151 Type 2 diabetes mellitus with diabetic peripheral angiopathy without gangrene: Secondary | ICD-10-CM | POA: Diagnosis not present

## 2021-05-16 DIAGNOSIS — N1832 Chronic kidney disease, stage 3b: Secondary | ICD-10-CM | POA: Diagnosis not present

## 2021-05-16 DIAGNOSIS — E7800 Pure hypercholesterolemia, unspecified: Secondary | ICD-10-CM | POA: Diagnosis not present

## 2021-05-16 DIAGNOSIS — I129 Hypertensive chronic kidney disease with stage 1 through stage 4 chronic kidney disease, or unspecified chronic kidney disease: Secondary | ICD-10-CM | POA: Diagnosis not present

## 2021-05-16 DIAGNOSIS — I10 Essential (primary) hypertension: Secondary | ICD-10-CM | POA: Diagnosis not present

## 2021-05-16 DIAGNOSIS — E1121 Type 2 diabetes mellitus with diabetic nephropathy: Secondary | ICD-10-CM | POA: Diagnosis not present

## 2021-06-26 DIAGNOSIS — E1151 Type 2 diabetes mellitus with diabetic peripheral angiopathy without gangrene: Secondary | ICD-10-CM | POA: Diagnosis not present

## 2021-06-26 DIAGNOSIS — N1832 Chronic kidney disease, stage 3b: Secondary | ICD-10-CM | POA: Diagnosis not present

## 2021-06-26 DIAGNOSIS — E1142 Type 2 diabetes mellitus with diabetic polyneuropathy: Secondary | ICD-10-CM | POA: Diagnosis not present

## 2021-06-26 DIAGNOSIS — Z1389 Encounter for screening for other disorder: Secondary | ICD-10-CM | POA: Diagnosis not present

## 2021-06-26 DIAGNOSIS — I129 Hypertensive chronic kidney disease with stage 1 through stage 4 chronic kidney disease, or unspecified chronic kidney disease: Secondary | ICD-10-CM | POA: Diagnosis not present

## 2021-06-26 DIAGNOSIS — Z794 Long term (current) use of insulin: Secondary | ICD-10-CM | POA: Diagnosis not present

## 2021-06-26 DIAGNOSIS — Z6838 Body mass index (BMI) 38.0-38.9, adult: Secondary | ICD-10-CM | POA: Diagnosis not present

## 2021-06-26 DIAGNOSIS — E1121 Type 2 diabetes mellitus with diabetic nephropathy: Secondary | ICD-10-CM | POA: Diagnosis not present

## 2021-06-26 DIAGNOSIS — I739 Peripheral vascular disease, unspecified: Secondary | ICD-10-CM | POA: Diagnosis not present

## 2021-06-26 DIAGNOSIS — E7800 Pure hypercholesterolemia, unspecified: Secondary | ICD-10-CM | POA: Diagnosis not present

## 2021-06-26 DIAGNOSIS — Z0000 Encounter for general adult medical examination without abnormal findings: Secondary | ICD-10-CM | POA: Diagnosis not present

## 2021-08-08 DIAGNOSIS — E1121 Type 2 diabetes mellitus with diabetic nephropathy: Secondary | ICD-10-CM | POA: Diagnosis not present

## 2021-08-08 DIAGNOSIS — E1142 Type 2 diabetes mellitus with diabetic polyneuropathy: Secondary | ICD-10-CM | POA: Diagnosis not present

## 2021-08-08 DIAGNOSIS — N1832 Chronic kidney disease, stage 3b: Secondary | ICD-10-CM | POA: Diagnosis not present

## 2021-08-08 DIAGNOSIS — E1151 Type 2 diabetes mellitus with diabetic peripheral angiopathy without gangrene: Secondary | ICD-10-CM | POA: Diagnosis not present

## 2021-08-08 DIAGNOSIS — F329 Major depressive disorder, single episode, unspecified: Secondary | ICD-10-CM | POA: Diagnosis not present

## 2021-08-08 DIAGNOSIS — I129 Hypertensive chronic kidney disease with stage 1 through stage 4 chronic kidney disease, or unspecified chronic kidney disease: Secondary | ICD-10-CM | POA: Diagnosis not present

## 2021-08-08 DIAGNOSIS — E7800 Pure hypercholesterolemia, unspecified: Secondary | ICD-10-CM | POA: Diagnosis not present

## 2021-08-08 DIAGNOSIS — I10 Essential (primary) hypertension: Secondary | ICD-10-CM | POA: Diagnosis not present

## 2021-08-08 DIAGNOSIS — E1165 Type 2 diabetes mellitus with hyperglycemia: Secondary | ICD-10-CM | POA: Diagnosis not present

## 2021-09-07 DIAGNOSIS — E1121 Type 2 diabetes mellitus with diabetic nephropathy: Secondary | ICD-10-CM | POA: Diagnosis not present

## 2021-09-07 DIAGNOSIS — F329 Major depressive disorder, single episode, unspecified: Secondary | ICD-10-CM | POA: Diagnosis not present

## 2021-09-07 DIAGNOSIS — I10 Essential (primary) hypertension: Secondary | ICD-10-CM | POA: Diagnosis not present

## 2021-09-07 DIAGNOSIS — E7800 Pure hypercholesterolemia, unspecified: Secondary | ICD-10-CM | POA: Diagnosis not present

## 2021-09-07 DIAGNOSIS — I129 Hypertensive chronic kidney disease with stage 1 through stage 4 chronic kidney disease, or unspecified chronic kidney disease: Secondary | ICD-10-CM | POA: Diagnosis not present

## 2021-09-07 DIAGNOSIS — N1832 Chronic kidney disease, stage 3b: Secondary | ICD-10-CM | POA: Diagnosis not present

## 2021-09-07 DIAGNOSIS — E1142 Type 2 diabetes mellitus with diabetic polyneuropathy: Secondary | ICD-10-CM | POA: Diagnosis not present

## 2021-09-12 IMAGING — CR DG HIP (WITH OR WITHOUT PELVIS) 3-4V BILAT
3 series · 3 of 3 positions shown · non-contrast
Comparison: 05/19/2018

CLINICAL DATA: Bilateral hip pain

EXAM:
DG HIP (WITH OR WITHOUT PELVIS) 3-4V BILAT

[t pelvis a.p.]
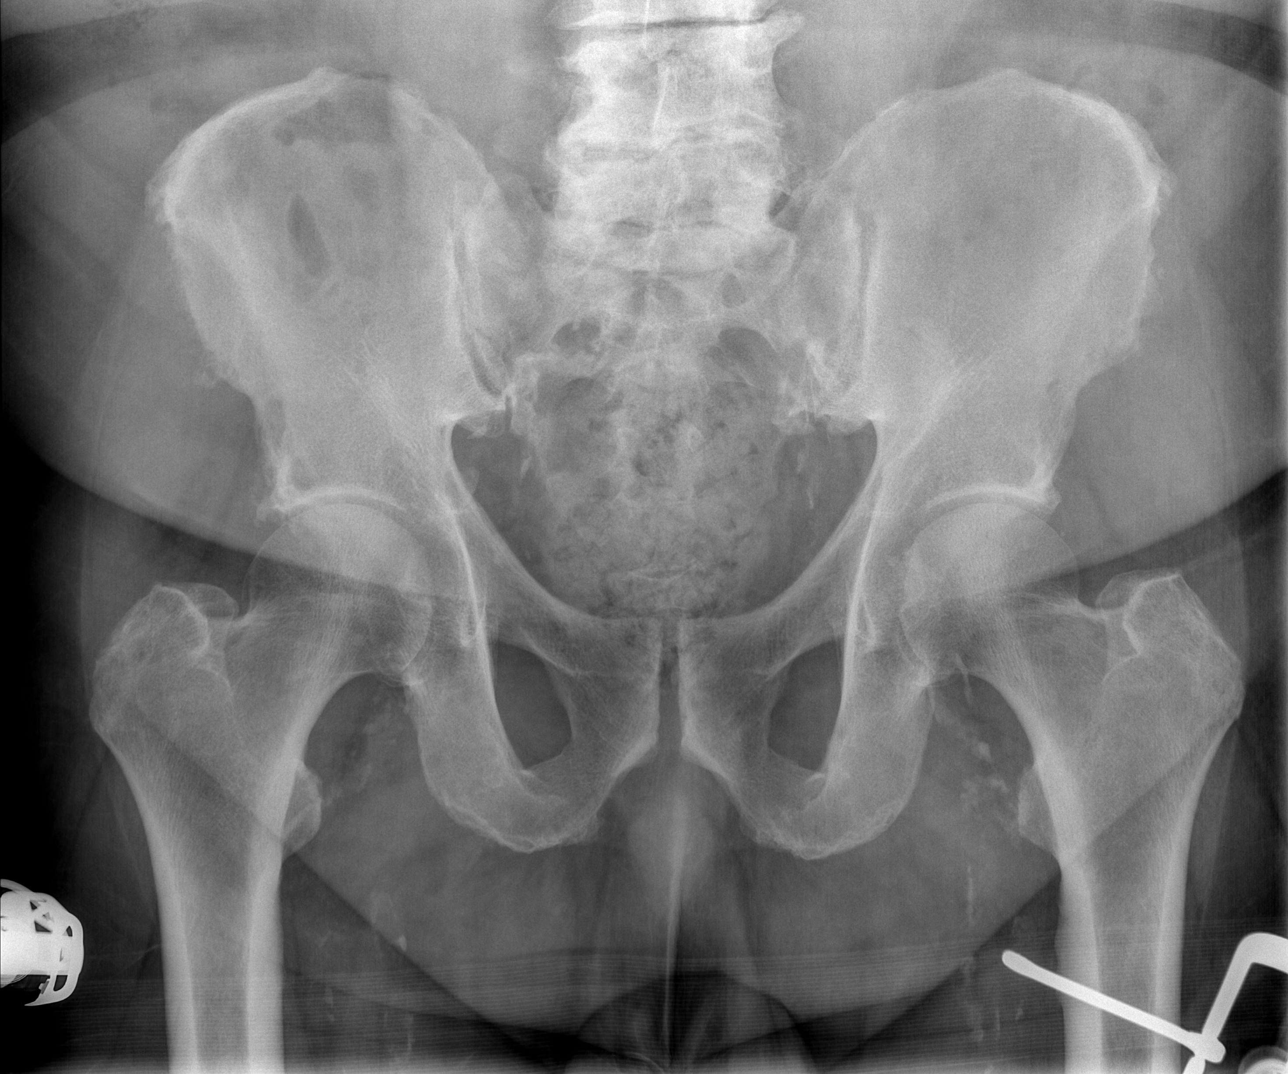

[t hip frog leg left]
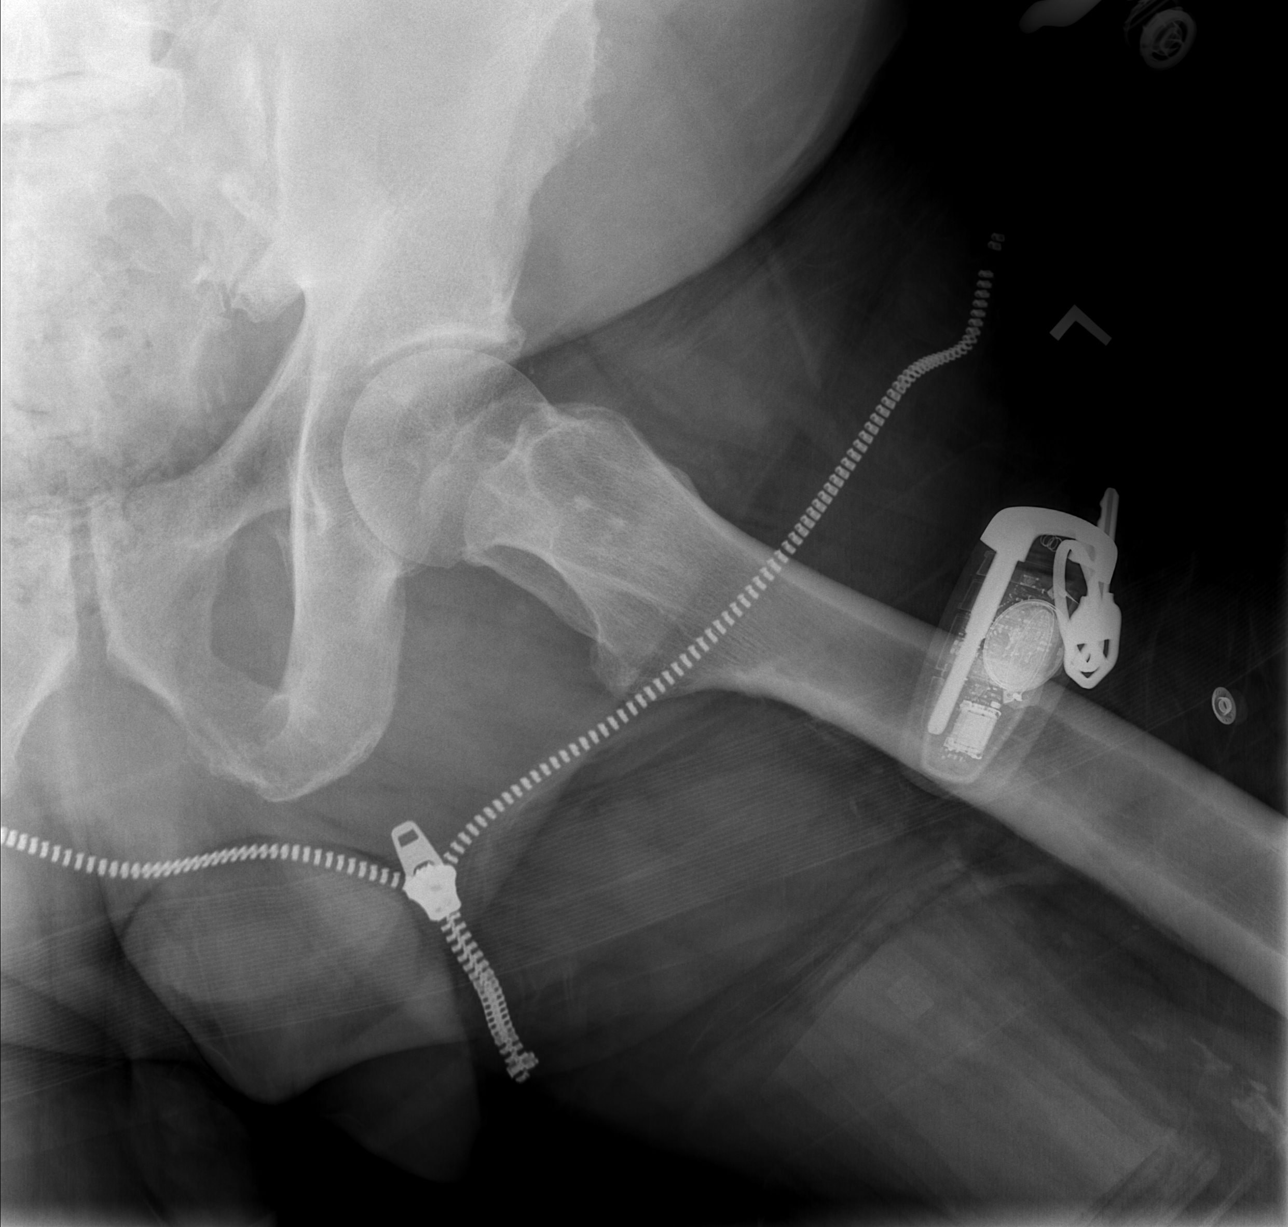

[t hip frog leg right]
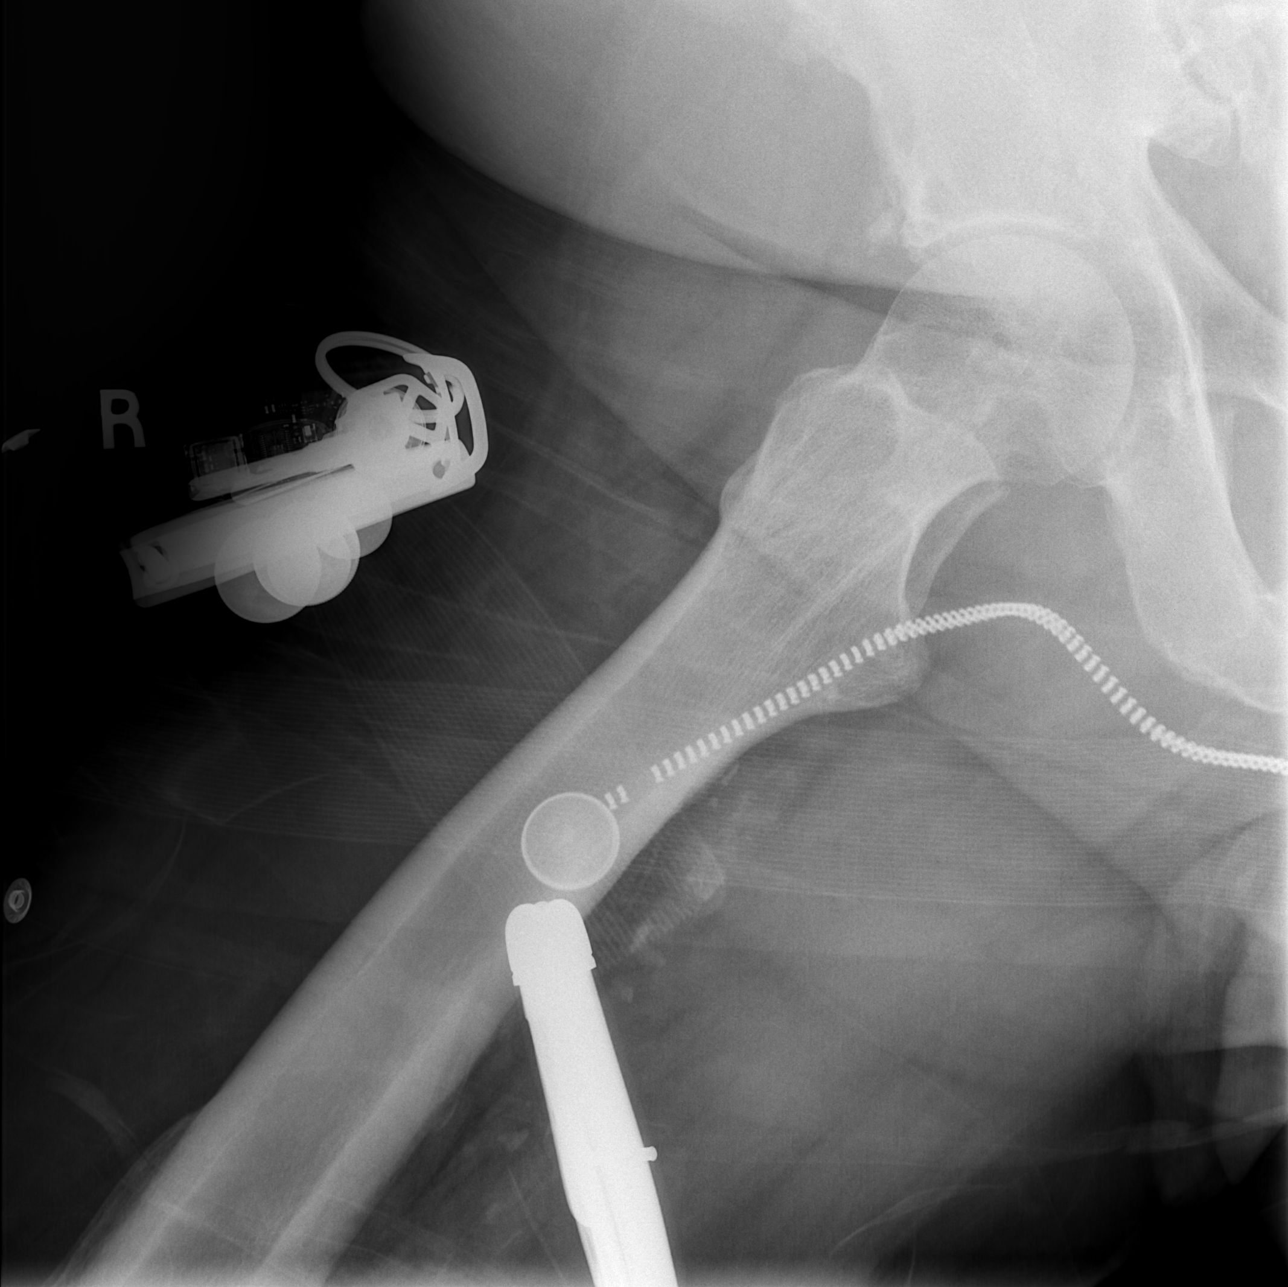

[3 of 3 positions shown; findings below may reference images not displayed]

FINDINGS: SI joints are patent. Pubic symphysis and rami are intact. There are
bilateral vascular calcifications. Mild right greater than left hip
arthritis.
IMPRESSION: Mild right greater than left hip arthritis

## 2021-11-02 ENCOUNTER — Ambulatory Visit (HOSPITAL_COMMUNITY)
Admission: RE | Admit: 2021-11-02 | Discharge: 2021-11-02 | Disposition: A | Discharge status: Home / Self Care | Payer: Medicare PPO | Source: Ambulatory Visit | Attending: Cardiovascular Disease | Admitting: Cardiovascular Disease

## 2021-11-02 ENCOUNTER — Other Ambulatory Visit: Payer: Self-pay

## 2021-11-02 DIAGNOSIS — I739 Peripheral vascular disease, unspecified: Secondary | ICD-10-CM | POA: Diagnosis not present

## 2021-11-14 ENCOUNTER — Telehealth: Payer: Self-pay

## 2021-11-14 NOTE — Telephone Encounter (Addendum)
-----   Message from Lorretta Harp, MD ----- Has occluded [facility] and high grade Left popliteal artery stenosis. Needs ROV to discuss

## 2021-11-14 NOTE — Telephone Encounter (Signed)
Called pt. No VM set up. Will attempt to call again later.

## 2021-11-28 ENCOUNTER — Ambulatory Visit: Payer: Medicare PPO | Admitting: Cardiovascular Disease

## 2021-11-29 ENCOUNTER — Other Ambulatory Visit: Payer: Self-pay

## 2021-11-29 ENCOUNTER — Encounter: Payer: Self-pay | Admitting: Cardiovascular Disease

## 2021-11-29 ENCOUNTER — Ambulatory Visit (INDEPENDENT_AMBULATORY_CARE_PROVIDER_SITE_OTHER): Payer: Medicare PPO | Admitting: Cardiovascular Disease

## 2021-11-29 VITALS — BP 130/78 | HR 72 | Ht 69.05 in | Wt 264.8 lb

## 2021-11-29 DIAGNOSIS — I2510 Atherosclerotic heart disease of native coronary artery without angina pectoris: Secondary | ICD-10-CM

## 2021-11-29 DIAGNOSIS — E782 Mixed hyperlipidemia: Secondary | ICD-10-CM | POA: Diagnosis not present

## 2021-11-29 DIAGNOSIS — I739 Peripheral vascular disease, unspecified: Secondary | ICD-10-CM

## 2021-11-29 DIAGNOSIS — I10 Essential (primary) hypertension: Secondary | ICD-10-CM

## 2021-11-29 NOTE — Assessment & Plan Note (Signed)
History of CAD status post stenting of his LAD March 2012 in [place].  He is asymptomatic from this.

## 2021-11-29 NOTE — Assessment & Plan Note (Signed)
Kyle Delgado was referred to me by Dr. Laurann Montana for PAD.  I performed staged right and left SFA intervention in April and May 2021.  His postprocedure Dopplers normalized.  He now tells me that he cannot tell any difference with regards to his legs pre or post procedure.  His most recent Dopplers performed 11/02/2021 revealed a decline in his ABIs bilaterally to 0.85 on the right and 0.76 on the left with what appears to be an occluded mid right SFA and high-grade left popliteal artery restenosis.  Given the fact that his symptoms did not improve by his own account post intervention I do not feel need to recommend Rie intervention at this time.

## 2021-11-29 NOTE — Patient Instructions (Signed)
Medication Instructions:  The current medical regimen is effective;  continue present plan and medications.  *If you need a refill on your cardiac medications before your next appointment, please call your pharmacy*    Follow-Up: At [facility], you and your health needs are our priority.  As part of our continuing mission to provide you with exceptional heart care, we have created designated Provider Care Teams.  These Care Teams include your primary Cardiologist (physician) and Advanced Practice Providers (APPs -  Physician Assistants and Nurse Practitioners) who all work together to provide you with the care you need, when you need it.  We recommend signing up for the patient portal called "MyChart".  Sign up information is provided on this After Visit Summary.  MyChart is used to connect with patients for Virtual Visits (Telemedicine).  Patients are able to view lab/test results, encounter notes, upcoming appointments, etc.  Non-urgent messages can be sent to your provider as well.   To learn more about what you can do with MyChart, go to [email or web address].    Your next appointment:   As needed  The format for your next appointment:   In Person  Provider:   Jonathan Berry, MD     

## 2021-11-29 NOTE — Assessment & Plan Note (Signed)
History of hyperlipidemia on statin therapy with lipid profile performed 06/26/2021 revealing total cholesterol 159, LDL 102 and HDL of 37.  He is not at goal currently for secondary prevention

## 2021-11-29 NOTE — Progress Notes (Signed)
11/29/2021 Kyle Delgado   09-15-1946  [id number]  Primary Physician Lavone Orn, MD Primary Cardiologist: Lorretta Harp MD [name], [place]  HPI:  Kyle Delgado is a 76 y.o.  severely overweight single African-American male with no children who is retired from working at [employer] .  He was referred by Dr. Laurann Montana for second opinion for evaluation treatment of PAD.  I last saw him in the office 11/07/2020. His risk factors include remote tobacco abuse, treated hypertension, diabetes and hyperlipidemia.  Is never had a heart attack or stroke.  There is no family history of heart disease.  He denies chest pain or shortness of breath.  He does have obstructive sleep apnea intolerant to CPAP.  He had stenting of his LAD March 2012 in [place] and is asymptomatic from this.  Over the last year or 2 has had lifestyle limiting claudication with work-up performed at the [facility] thought not to be a candidate for intervention because of CKD 3.  He is referred here for second opinion.   He did have Dopplers performed in office 11/14/2019 revealing a right ABI of 0.75 and a left of 0.60.  He had a high-frequency signal in his mid right SFA with an occluded anterior tibial, moderate disease in his left SFA with occluded posterior tibial.    I performed peripheral angiography on him 03/20/2020 using a total of 170 cc of contrast.  I demonstrated bilateral SFA and tibial vessel disease.  I performed orbital atherectomy of the high-grade calcified distal right SFA stenosis and directional atherectomy of the proximal lesion using distal protection.  His right lower extremity claudication has resolved and his Dopplers have normalized.  He wishes to proceed with left lower extremity intervention for lifestyle limiting claudication.   I performed left SFA intervention 04/17/2020 with orbital atherectomy of the left popliteal artery with PTA and left SFA with orbital atherectomy and drug-coated  balloon angioplasty.  His ABIs normalized and his claudication has resolved bilaterally.   Since I saw him in the office a year ago he continues to do well.  He still has some discomfort when walking although he says that his legs feel no different pre or post intervention.  His most recent Doppler studies performed 11/02/2021 revealed a decline in his ABIs bilaterally down to 0.85 on the right and 0.  7 6 and the left.  He has an occluded right SFA and high-grade left popliteal artery stenosis.  He denies chest pain or shortness of breath.  Current Meds  Medication Sig   aspirin 81 MG tablet Take 81 mg by mouth at bedtime.    atorvastatin (LIPITOR) 80 MG tablet Take 40 mg by mouth at bedtime.   Carboxymethylcellulose Sodium (THERATEARS OP) Place 1 drop into both eyes daily as needed (dry eyes).   Cholecalciferol (VITAMIN D) 50 MCG (2000 UT) tablet Take 4,000 Units by mouth daily.   Emollient (EUCERIN EX) Apply 1 application topically daily.    gabapentin (NEURONTIN) 100 MG capsule 1 capsule   insulin aspart protamine- aspart (NOVOLOG MIX 70/30) (70-30) 100 UNIT/ML injection Inject 70 Units into the skin 2 (two) times daily before a meal.    lisinopril (PRINIVIL,ZESTRIL) 40 MG tablet Take 40 mg by mouth at bedtime.    metFORMIN (GLUCOPHAGE) 1000 MG tablet Take 1,000 mg by mouth 2 (two) times daily with a meal.   metoprolol succinate (TOPROL-XL) 25 MG 24 hr tablet TAKE ONE-HALF TABLET BY MOUTH ONCE DAILY FOR  BLOOD PRESSURE   NON FORMULARY Insulin Pen Needle 31G X 8 MM Miscellaneous as directed   sildenafil (VIAGRA) 100 MG tablet Take 100 mg by mouth daily as needed for erectile dysfunction.     Allergies  Allergen Reactions   Januvia [Sitagliptin]     pancreatitis   Ozempic (0.25 Or 0.5 Mg-Dose) [Semaglutide(0.25 Or 0.5mg -Dos)]     Other reaction(s): abdominal pain    Social History   Socioeconomic History   Marital status: Single    Spouse name: Not on file   Number of children: Not  on file   Years of education: Not on file   Highest education level: Not on file  Occupational History   Not on file  Tobacco Use   Smoking status: Former    Packs/day: 1.00    Types: Cigarettes    Quit date: 2008    Years since quitting: 15.0   Smokeless tobacco: Never  Substance and Sexual Activity   Alcohol use: No   Drug use: No   Sexual activity: Not on file  Other Topics Concern   Not on file  Social History Narrative   Not on file   Social Determinants of Health   Financial Resource Strain: Not on file  Food Insecurity: Not on file  Transportation Needs: Not on file  Physical Activity: Not on file  Stress: Not on file  Social Connections: Not on file  Intimate Partner Violence: Not on file     Review of Systems: General: negative for chills, fever, night sweats or weight changes.  Cardiovascular: negative for chest pain, dyspnea on exertion, edema, orthopnea, palpitations, paroxysmal nocturnal dyspnea or shortness of breath Dermatological: negative for rash Respiratory: negative for cough or wheezing Urologic: negative for hematuria Abdominal: negative for nausea, vomiting, diarrhea, bright red blood per rectum, melena, or hematemesis Neurologic: negative for visual changes, syncope, or dizziness All other systems reviewed and are otherwise negative except as noted above.    Blood pressure 130/78, pulse 72, height 5' 9.05" (1.754 m), weight 264 lb 12.8 oz (120.1 kg), SpO2 96 %.  General appearance: alert and no distress Neck: no adenopathy, no carotid bruit, no JVD, supple, symmetrical, trachea midline, and thyroid not enlarged, symmetric, no tenderness/mass/nodules Lungs: clear to auscultation bilaterally Heart: regular rate and rhythm, S1, S2 normal, no murmur, click, rub or gallop Extremities: extremities normal, atraumatic, no cyanosis or edema Pulses: Diminished pedal pulses bilaterally Skin: Skin color, texture, turgor normal. No rashes or  lesions Neurologic: Grossly normal  EKG sinus rhythm at 72 with occasional PVCs and nonspecific ST and T wave changes.  Personally reviewed this EKG.  ASSESSMENT AND PLAN:   Hyperlipidemia History of hyperlipidemia on statin therapy with lipid profile performed 06/26/2021 revealing total cholesterol 159, LDL 102 and HDL of 37.  He is not at goal currently for secondary prevention  Essential hypertension History of essential hypertension blood pressure measured today 130/78.  He is on lisinopril and metoprolol.  Coronary artery disease History of CAD status post stenting of his LAD March 2012 in [place].  He is asymptomatic from this.  Claudication in peripheral vascular disease [facility]) Mr. Antilla was referred to me by Dr. Laurann Montana for PAD.  I performed staged right and left SFA intervention in April and May 2021.  His postprocedure Dopplers normalized.  He now tells me that he cannot tell any difference with regards to his legs pre or post procedure.  His most recent Dopplers performed 11/02/2021 revealed a decline in his ABIs  bilaterally to 0.85 on the right and 0.76 on the left with what appears to be an occluded mid right SFA and high-grade left popliteal artery restenosis.  Given the fact that his symptoms did not improve by his own account post intervention I do not feel need to recommend Rie intervention at this time.     Lorretta Harp MD FACP,FACC,FAHA, [facility] 11/29/2021 2:12 PM

## 2021-11-29 NOTE — Assessment & Plan Note (Signed)
History of essential hypertension blood pressure measured today 130/78.  He is on lisinopril and metoprolol.

## 2022-02-22 DIAGNOSIS — N1832 Chronic kidney disease, stage 3b: Secondary | ICD-10-CM | POA: Diagnosis not present

## 2022-02-22 DIAGNOSIS — E1121 Type 2 diabetes mellitus with diabetic nephropathy: Secondary | ICD-10-CM | POA: Diagnosis not present

## 2022-02-22 DIAGNOSIS — I10 Essential (primary) hypertension: Secondary | ICD-10-CM | POA: Diagnosis not present

## 2022-02-22 DIAGNOSIS — E7800 Pure hypercholesterolemia, unspecified: Secondary | ICD-10-CM | POA: Diagnosis not present

## 2022-03-22 DIAGNOSIS — F329 Major depressive disorder, single episode, unspecified: Secondary | ICD-10-CM | POA: Diagnosis not present

## 2022-03-22 DIAGNOSIS — E7800 Pure hypercholesterolemia, unspecified: Secondary | ICD-10-CM | POA: Diagnosis not present

## 2022-03-22 DIAGNOSIS — I129 Hypertensive chronic kidney disease with stage 1 through stage 4 chronic kidney disease, or unspecified chronic kidney disease: Secondary | ICD-10-CM | POA: Diagnosis not present

## 2022-03-22 DIAGNOSIS — E1121 Type 2 diabetes mellitus with diabetic nephropathy: Secondary | ICD-10-CM | POA: Diagnosis not present

## 2022-03-22 DIAGNOSIS — I10 Essential (primary) hypertension: Secondary | ICD-10-CM | POA: Diagnosis not present

## 2022-04-12 DIAGNOSIS — E7800 Pure hypercholesterolemia, unspecified: Secondary | ICD-10-CM | POA: Diagnosis not present

## 2022-04-12 DIAGNOSIS — E1121 Type 2 diabetes mellitus with diabetic nephropathy: Secondary | ICD-10-CM | POA: Diagnosis not present

## 2022-04-12 DIAGNOSIS — I129 Hypertensive chronic kidney disease with stage 1 through stage 4 chronic kidney disease, or unspecified chronic kidney disease: Secondary | ICD-10-CM | POA: Diagnosis not present

## 2022-04-12 DIAGNOSIS — F329 Major depressive disorder, single episode, unspecified: Secondary | ICD-10-CM | POA: Diagnosis not present

## 2022-04-12 DIAGNOSIS — I2510 Atherosclerotic heart disease of native coronary artery without angina pectoris: Secondary | ICD-10-CM | POA: Diagnosis not present

## 2022-04-12 DIAGNOSIS — I10 Essential (primary) hypertension: Secondary | ICD-10-CM | POA: Diagnosis not present

## 2022-04-12 DIAGNOSIS — N1832 Chronic kidney disease, stage 3b: Secondary | ICD-10-CM | POA: Diagnosis not present

## 2022-05-24 DIAGNOSIS — E7800 Pure hypercholesterolemia, unspecified: Secondary | ICD-10-CM | POA: Diagnosis not present

## 2022-05-24 DIAGNOSIS — I10 Essential (primary) hypertension: Secondary | ICD-10-CM | POA: Diagnosis not present

## 2022-05-24 DIAGNOSIS — N183 Chronic kidney disease, stage 3 unspecified: Secondary | ICD-10-CM | POA: Diagnosis not present

## 2022-05-24 DIAGNOSIS — I129 Hypertensive chronic kidney disease with stage 1 through stage 4 chronic kidney disease, or unspecified chronic kidney disease: Secondary | ICD-10-CM | POA: Diagnosis not present

## 2022-05-24 DIAGNOSIS — E1121 Type 2 diabetes mellitus with diabetic nephropathy: Secondary | ICD-10-CM | POA: Diagnosis not present

## 2022-05-24 DIAGNOSIS — F329 Major depressive disorder, single episode, unspecified: Secondary | ICD-10-CM | POA: Diagnosis not present

## 2022-07-03 DIAGNOSIS — Z1331 Encounter for screening for depression: Secondary | ICD-10-CM | POA: Diagnosis not present

## 2022-07-03 DIAGNOSIS — I739 Peripheral vascular disease, unspecified: Secondary | ICD-10-CM | POA: Diagnosis not present

## 2022-07-03 DIAGNOSIS — I129 Hypertensive chronic kidney disease with stage 1 through stage 4 chronic kidney disease, or unspecified chronic kidney disease: Secondary | ICD-10-CM | POA: Diagnosis not present

## 2022-07-03 DIAGNOSIS — Z6839 Body mass index (BMI) 39.0-39.9, adult: Secondary | ICD-10-CM | POA: Diagnosis not present

## 2022-07-03 DIAGNOSIS — Z0000 Encounter for general adult medical examination without abnormal findings: Secondary | ICD-10-CM | POA: Diagnosis not present

## 2022-07-03 DIAGNOSIS — E1121 Type 2 diabetes mellitus with diabetic nephropathy: Secondary | ICD-10-CM | POA: Diagnosis not present

## 2022-07-03 DIAGNOSIS — N1832 Chronic kidney disease, stage 3b: Secondary | ICD-10-CM | POA: Diagnosis not present

## 2022-07-03 DIAGNOSIS — E1142 Type 2 diabetes mellitus with diabetic polyneuropathy: Secondary | ICD-10-CM | POA: Diagnosis not present

## 2022-07-03 DIAGNOSIS — E7800 Pure hypercholesterolemia, unspecified: Secondary | ICD-10-CM | POA: Diagnosis not present

## 2022-07-03 DIAGNOSIS — E1151 Type 2 diabetes mellitus with diabetic peripheral angiopathy without gangrene: Secondary | ICD-10-CM | POA: Diagnosis not present

## 2022-09-04 DIAGNOSIS — E7800 Pure hypercholesterolemia, unspecified: Secondary | ICD-10-CM | POA: Diagnosis not present

## 2022-09-04 DIAGNOSIS — I10 Essential (primary) hypertension: Secondary | ICD-10-CM | POA: Diagnosis not present

## 2022-09-04 DIAGNOSIS — N1832 Chronic kidney disease, stage 3b: Secondary | ICD-10-CM | POA: Diagnosis not present

## 2022-09-04 DIAGNOSIS — E1121 Type 2 diabetes mellitus with diabetic nephropathy: Secondary | ICD-10-CM | POA: Diagnosis not present

## 2023-07-17 DIAGNOSIS — E1121 Type 2 diabetes mellitus with diabetic nephropathy: Secondary | ICD-10-CM | POA: Diagnosis not present

## 2023-07-17 DIAGNOSIS — E1142 Type 2 diabetes mellitus with diabetic polyneuropathy: Secondary | ICD-10-CM | POA: Diagnosis not present

## 2023-07-17 DIAGNOSIS — Z0000 Encounter for general adult medical examination without abnormal findings: Secondary | ICD-10-CM | POA: Diagnosis not present

## 2023-07-17 DIAGNOSIS — E1151 Type 2 diabetes mellitus with diabetic peripheral angiopathy without gangrene: Secondary | ICD-10-CM | POA: Diagnosis not present

## 2023-07-17 DIAGNOSIS — N1832 Chronic kidney disease, stage 3b: Secondary | ICD-10-CM | POA: Diagnosis not present

## 2023-07-17 DIAGNOSIS — I129 Hypertensive chronic kidney disease with stage 1 through stage 4 chronic kidney disease, or unspecified chronic kidney disease: Secondary | ICD-10-CM | POA: Diagnosis not present

## 2023-07-17 DIAGNOSIS — E1165 Type 2 diabetes mellitus with hyperglycemia: Secondary | ICD-10-CM | POA: Diagnosis not present

## 2023-07-17 DIAGNOSIS — E7800 Pure hypercholesterolemia, unspecified: Secondary | ICD-10-CM | POA: Diagnosis not present

## 2023-07-17 DIAGNOSIS — I2510 Atherosclerotic heart disease of native coronary artery without angina pectoris: Secondary | ICD-10-CM | POA: Diagnosis not present

## 2023-07-17 DIAGNOSIS — Z1331 Encounter for screening for depression: Secondary | ICD-10-CM | POA: Diagnosis not present

## 2023-12-19 ENCOUNTER — Emergency Department (HOSPITAL_COMMUNITY): Payer: No Typology Code available for payment source

## 2023-12-19 ENCOUNTER — Inpatient Hospital Stay (HOSPITAL_COMMUNITY): Payer: No Typology Code available for payment source

## 2023-12-19 ENCOUNTER — Inpatient Hospital Stay (HOSPITAL_COMMUNITY)
Admission: EM | Admit: 2023-12-19 | Discharge: 2023-12-24 | DRG: 256 | Disposition: A | Discharge status: Home / Self Care | Payer: No Typology Code available for payment source | Attending: Internal Medicine | Admitting: Internal Medicine

## 2023-12-19 ENCOUNTER — Encounter (HOSPITAL_COMMUNITY): Payer: Self-pay

## 2023-12-19 ENCOUNTER — Other Ambulatory Visit: Payer: Self-pay

## 2023-12-19 DIAGNOSIS — M868X7 Other osteomyelitis, ankle and foot: Secondary | ICD-10-CM | POA: Diagnosis present

## 2023-12-19 DIAGNOSIS — N179 Acute kidney failure, unspecified: Secondary | ICD-10-CM | POA: Diagnosis present

## 2023-12-19 DIAGNOSIS — Z794 Long term (current) use of insulin: Secondary | ICD-10-CM | POA: Diagnosis not present

## 2023-12-19 DIAGNOSIS — E1151 Type 2 diabetes mellitus with diabetic peripheral angiopathy without gangrene: Principal | ICD-10-CM | POA: Diagnosis present

## 2023-12-19 DIAGNOSIS — E871 Hypo-osmolality and hyponatremia: Secondary | ICD-10-CM | POA: Diagnosis present

## 2023-12-19 DIAGNOSIS — I129 Hypertensive chronic kidney disease with stage 1 through stage 4 chronic kidney disease, or unspecified chronic kidney disease: Secondary | ICD-10-CM | POA: Diagnosis present

## 2023-12-19 DIAGNOSIS — E1165 Type 2 diabetes mellitus with hyperglycemia: Secondary | ICD-10-CM | POA: Diagnosis present

## 2023-12-19 DIAGNOSIS — E66812 Obesity, class 2: Secondary | ICD-10-CM | POA: Diagnosis present

## 2023-12-19 DIAGNOSIS — D75839 Thrombocytosis, unspecified: Secondary | ICD-10-CM | POA: Diagnosis present

## 2023-12-19 DIAGNOSIS — M869 Osteomyelitis, unspecified: Secondary | ICD-10-CM | POA: Diagnosis not present

## 2023-12-19 DIAGNOSIS — D631 Anemia in chronic kidney disease: Secondary | ICD-10-CM | POA: Diagnosis present

## 2023-12-19 DIAGNOSIS — E1169 Type 2 diabetes mellitus with other specified complication: Secondary | ICD-10-CM | POA: Diagnosis present

## 2023-12-19 DIAGNOSIS — L02612 Cutaneous abscess of left foot: Secondary | ICD-10-CM | POA: Diagnosis not present

## 2023-12-19 DIAGNOSIS — E1122 Type 2 diabetes mellitus with diabetic chronic kidney disease: Secondary | ICD-10-CM | POA: Diagnosis present

## 2023-12-19 DIAGNOSIS — L089 Local infection of the skin and subcutaneous tissue, unspecified: Principal | ICD-10-CM | POA: Diagnosis present

## 2023-12-19 DIAGNOSIS — Z833 Family history of diabetes mellitus: Secondary | ICD-10-CM

## 2023-12-19 DIAGNOSIS — I70245 Atherosclerosis of native arteries of left leg with ulceration of other part of foot: Secondary | ICD-10-CM | POA: Diagnosis present

## 2023-12-19 DIAGNOSIS — Z79899 Other long term (current) drug therapy: Secondary | ICD-10-CM

## 2023-12-19 DIAGNOSIS — M00872 Arthritis due to other bacteria, left ankle and foot: Secondary | ICD-10-CM | POA: Diagnosis not present

## 2023-12-19 DIAGNOSIS — Z7982 Long term (current) use of aspirin: Secondary | ICD-10-CM

## 2023-12-19 DIAGNOSIS — Z7984 Long term (current) use of oral hypoglycemic drugs: Secondary | ICD-10-CM

## 2023-12-19 DIAGNOSIS — Z66 Do not resuscitate: Secondary | ICD-10-CM | POA: Diagnosis present

## 2023-12-19 DIAGNOSIS — I2510 Atherosclerotic heart disease of native coronary artery without angina pectoris: Secondary | ICD-10-CM | POA: Diagnosis present

## 2023-12-19 DIAGNOSIS — Z87891 Personal history of nicotine dependence: Secondary | ICD-10-CM | POA: Diagnosis not present

## 2023-12-19 DIAGNOSIS — E8721 Acute metabolic acidosis: Secondary | ICD-10-CM | POA: Diagnosis present

## 2023-12-19 DIAGNOSIS — E785 Hyperlipidemia, unspecified: Secondary | ICD-10-CM | POA: Diagnosis present

## 2023-12-19 DIAGNOSIS — Z860101 Personal history of adenomatous and serrated colon polyps: Secondary | ICD-10-CM

## 2023-12-19 DIAGNOSIS — M1990 Unspecified osteoarthritis, unspecified site: Secondary | ICD-10-CM | POA: Diagnosis present

## 2023-12-19 DIAGNOSIS — E860 Dehydration: Secondary | ICD-10-CM | POA: Diagnosis present

## 2023-12-19 DIAGNOSIS — Z955 Presence of coronary angioplasty implant and graft: Secondary | ICD-10-CM

## 2023-12-19 DIAGNOSIS — N1832 Chronic kidney disease, stage 3b: Secondary | ICD-10-CM | POA: Diagnosis present

## 2023-12-19 DIAGNOSIS — E1142 Type 2 diabetes mellitus with diabetic polyneuropathy: Secondary | ICD-10-CM | POA: Diagnosis present

## 2023-12-19 DIAGNOSIS — Z888 Allergy status to other drugs, medicaments and biological substances status: Secondary | ICD-10-CM

## 2023-12-19 DIAGNOSIS — Z6837 Body mass index (BMI) 37.0-37.9, adult: Secondary | ICD-10-CM

## 2023-12-19 LAB — CBC WITH DIFFERENTIAL/PLATELET
Abs Immature Granulocytes: 0.02 10*3/uL (ref 0.00–0.07)
Basophils Absolute: 0.1 10*3/uL (ref 0.0–0.1)
Basophils Relative: 1 %
Eosinophils Absolute: 0.2 10*3/uL (ref 0.0–0.5)
Eosinophils Relative: 2 %
HCT: 35.3 % — ABNORMAL LOW (ref 39.0–52.0)
Hemoglobin: 11 g/dL — ABNORMAL LOW (ref 13.0–17.0)
Immature Granulocytes: 0 %
Lymphocytes Relative: 12 %
Lymphs Abs: 1.1 10*3/uL (ref 0.7–4.0)
MCH: 25.5 pg — ABNORMAL LOW (ref 26.0–34.0)
MCHC: 31.2 g/dL (ref 30.0–36.0)
MCV: 81.9 fL (ref 80.0–100.0)
Monocytes Absolute: 0.6 10*3/uL (ref 0.1–1.0)
Monocytes Relative: 7 %
Neutro Abs: 7.4 10*3/uL (ref 1.7–7.7)
Neutrophils Relative %: 78 %
Platelets: 507 10*3/uL — ABNORMAL HIGH (ref 150–400)
RBC: 4.31 MIL/uL (ref 4.22–5.81)
RDW: 13.1 % (ref 11.5–15.5)
WBC: 9.3 10*3/uL (ref 4.0–10.5)
nRBC: 0 % (ref 0.0–0.2)

## 2023-12-19 LAB — COMPREHENSIVE METABOLIC PANEL
ALT: 16 U/L (ref 0–44)
AST: 14 U/L — ABNORMAL LOW (ref 15–41)
Albumin: 3.1 g/dL — ABNORMAL LOW (ref 3.5–5.0)
Alkaline Phosphatase: 70 U/L (ref 38–126)
Anion gap: 10 (ref 5–15)
BUN: 64 mg/dL — ABNORMAL HIGH (ref 8–23)
CO2: 19 mmol/L — ABNORMAL LOW (ref 22–32)
Calcium: 8.7 mg/dL — ABNORMAL LOW (ref 8.9–10.3)
Chloride: 105 mmol/L (ref 98–111)
Creatinine, Ser: 3.72 mg/dL — ABNORMAL HIGH (ref 0.61–1.24)
GFR, Estimated: 16 mL/min — ABNORMAL LOW (ref >60)
Glucose, Bld: 198 mg/dL — ABNORMAL HIGH (ref 70–99)
Potassium: 5.1 mmol/L (ref 3.5–5.1)
Sodium: 134 mmol/L — ABNORMAL LOW (ref 135–145)
Total Bilirubin: 0.7 mg/dL (ref 0.0–1.2)
Total Protein: 7.2 g/dL (ref 6.5–8.1)

## 2023-12-19 LAB — CBG MONITORING, ED: Glucose-Capillary: 216 mg/dL — ABNORMAL HIGH (ref 70–99)

## 2023-12-19 LAB — I-STAT CG4 LACTIC ACID, ED: Lactic Acid, Venous: 0.6 mmol/L (ref 0.5–1.9)

## 2023-12-19 MED ORDER — SODIUM CHLORIDE 0.9 % IV BOLUS
500.0000 mL | Freq: Once | INTRAVENOUS | Status: AC
  Administered 2023-12-19: 500 mL via INTRAVENOUS

## 2023-12-19 MED ORDER — SODIUM CHLORIDE 0.9 % IV SOLN: INTRAVENOUS | Status: DC

## 2023-12-19 MED ORDER — INSULIN ASPART 100 UNIT/ML IJ SOLN
0.0000 [IU] | Freq: Three times a day (TID) | INTRAMUSCULAR | Status: DC
  Administered 2023-12-20: 3 [IU] via SUBCUTANEOUS
  Administered 2023-12-20: 1 [IU] via SUBCUTANEOUS
  Administered 2023-12-21: 3 [IU] via SUBCUTANEOUS
  Administered 2023-12-22: 1 [IU] via SUBCUTANEOUS
  Administered 2023-12-22: 3 [IU] via SUBCUTANEOUS
  Administered 2023-12-22 – 2023-12-23 (×2): 2 [IU] via SUBCUTANEOUS
  Administered 2023-12-23: 1 [IU] via SUBCUTANEOUS
  Administered 2023-12-23 – 2023-12-24 (×2): 2 [IU] via SUBCUTANEOUS

## 2023-12-19 MED ORDER — ACETAMINOPHEN 325 MG PO TABS
650.0000 mg | ORAL_TABLET | Freq: Four times a day (QID) | ORAL | Status: DC | PRN
  Administered 2023-12-22 – 2023-12-23 (×4): 650 mg via ORAL
  Filled 2023-12-19 (×4): qty 2

## 2023-12-19 MED ORDER — HEPARIN SODIUM (PORCINE) 5000 UNIT/ML IJ SOLN
5000.0000 [IU] | Freq: Three times a day (TID) | INTRAMUSCULAR | Status: DC
  Administered 2023-12-19 – 2023-12-20 (×2): 5000 [IU] via SUBCUTANEOUS
  Filled 2023-12-19 (×2): qty 1

## 2023-12-19 MED ORDER — INSULIN ASPART 100 UNIT/ML IJ SOLN
0.0000 [IU] | Freq: Every day | INTRAMUSCULAR | Status: DC
  Administered 2023-12-19 – 2023-12-20 (×2): 2 [IU] via SUBCUTANEOUS
  Administered 2023-12-21: 3 [IU] via SUBCUTANEOUS

## 2023-12-19 MED ORDER — OXYCODONE HCL 5 MG PO TABS
5.0000 mg | ORAL_TABLET | Freq: Four times a day (QID) | ORAL | Status: DC | PRN
  Administered 2023-12-21: 5 mg via ORAL
  Filled 2023-12-19: qty 1

## 2023-12-19 MED ORDER — VANCOMYCIN HCL 10 G IV SOLR
2500.0000 mg | Freq: Once | INTRAVENOUS | Status: AC
  Administered 2023-12-19: 2500 mg via INTRAVENOUS
  Filled 2023-12-19: qty 2500

## 2023-12-19 MED ORDER — HYDROMORPHONE HCL 1 MG/ML IJ SOLN
0.5000 mg | INTRAMUSCULAR | Status: DC | PRN
  Administered 2023-12-21: 0.5 mg via INTRAVENOUS
  Filled 2023-12-19: qty 1

## 2023-12-19 MED ORDER — LINEZOLID 600 MG/300ML IV SOLN
600.0000 mg | Freq: Two times a day (BID) | INTRAVENOUS | Status: DC
  Administered 2023-12-20 – 2023-12-21 (×3): 600 mg via INTRAVENOUS
  Filled 2023-12-19 (×4): qty 300

## 2023-12-19 MED ORDER — ATORVASTATIN CALCIUM 40 MG PO TABS
40.0000 mg | ORAL_TABLET | Freq: Every day | ORAL | Status: DC
  Administered 2023-12-19 – 2023-12-23 (×5): 40 mg via ORAL
  Filled 2023-12-19 (×5): qty 1

## 2023-12-19 MED ORDER — MELATONIN 5 MG PO TABS
5.0000 mg | ORAL_TABLET | Freq: Every evening | ORAL | Status: DC | PRN
  Administered 2023-12-20 – 2023-12-23 (×3): 5 mg via ORAL
  Filled 2023-12-19 (×4): qty 1

## 2023-12-19 MED ORDER — METOPROLOL SUCCINATE ER 25 MG PO TB24
12.5000 mg | ORAL_TABLET | Freq: Every day | ORAL | Status: DC
  Administered 2023-12-19 – 2023-12-24 (×6): 12.5 mg via ORAL
  Filled 2023-12-19 (×6): qty 1

## 2023-12-19 MED ORDER — POLYETHYLENE GLYCOL 3350 17 G PO PACK: 17.0000 g | PACK | Freq: Every day | ORAL | Status: DC | PRN

## 2023-12-19 MED ORDER — SODIUM CHLORIDE 0.9 % IV SOLN
2.0000 g | INTRAVENOUS | Status: DC
  Administered 2023-12-19 – 2023-12-23 (×5): 2 g via INTRAVENOUS
  Filled 2023-12-19 (×5): qty 20

## 2023-12-19 MED ORDER — VANCOMYCIN HCL IN DEXTROSE 1-5 GM/200ML-% IV SOLN: 1000.0000 mg | Freq: Once | INTRAVENOUS | Status: DC

## 2023-12-19 MED ORDER — PROCHLORPERAZINE EDISYLATE 10 MG/2ML IJ SOLN
5.0000 mg | Freq: Four times a day (QID) | INTRAMUSCULAR | Status: DC | PRN
  Administered 2023-12-21: 5 mg via INTRAVENOUS
  Filled 2023-12-19: qty 2

## 2023-12-19 MED ORDER — GABAPENTIN 100 MG PO CAPS
200.0000 mg | ORAL_CAPSULE | Freq: Every day | ORAL | Status: DC
  Administered 2023-12-19 – 2023-12-23 (×5): 200 mg via ORAL
  Filled 2023-12-19 (×5): qty 2

## 2023-12-19 NOTE — ED Provider Triage Note (Signed)
Emergency Medicine Provider Triage Evaluation Note  Kyle Delgado , a 78 y.o. male  was evaluated in triage.  Pt  with history of diabetes complains of left fifth toe swelling.  States he was sent over by a doctor he saw earlier today.  He is not on any antibiotics currently for infection. Denies fevers or chills  Review of Systems  Positive: As above Negative: As above  Physical Exam  BP (!) 152/89 (BP Location: Right Arm)   Pulse 75   Temp 98.5 F (36.9 C)   Resp 15   Ht 5\' 9"  (1.753 m)   Wt 115.7 kg   SpO2 93%   BMI 37.66 kg/m  Gen:   Awake, no distress   Resp:  Normal effort  MSK:   Moves extremities without difficulty   Left little toe with ulceration and serous drainage  Dorsalis pedis pulse 1+ left  2+ edema of LLE  Medical Decision Making  Medically screening exam initiated at 12:05 PM.  Appropriate orders placed.  Breeze Angell was informed that the remainder of the evaluation will be completed by another provider, this initial triage assessment does not replace that evaluation, and the importance of remaining in the ED until their evaluation is complete.     Arabella Merles, PA-C 12/19/23 1207

## 2023-12-19 NOTE — ED Triage Notes (Signed)
Pt her for left pinky toe pain. Pt arrives with bandage on toe. Pt states foot is swollen and is new. Denies fevers.

## 2023-12-19 NOTE — ED Notes (Signed)
Assumed pt care at this time

## 2023-12-19 NOTE — H&P (Addendum)
History and Physical  Kyle Delgado ZOX:[id number] DOB: Aug 10, 1946 DOA: 12/19/2023  Referring physician: Dr. Ruthy Dick, EDP  PCP: Kirby Funk, MD (Inactive)  Outpatient Specialists: Podiatry, cardiology [facility]. Patient coming from: Home  Chief Complaint: Left fifth toe infection  HPI: Kyle Delgado is a 78 y.o. male with medical history significant for uncontrolled type 2 diabetes, hypertension, hyperlipidemia, CKD 3B, obesity, coronary artery disease status post PCI with stent placement, sent from the [facility] due to concern for left fifth toe osteomyelitis.  The patient endorses trimming a corn on the lateral aspect of his left fifth toe about a week ago.  The day after, he noted edema, erythema of the left fifth toe that progressively worsened.  Denies subjective fevers or chills.  States he barely has any sensation in his toes.  He takes gabapentin for diabetic polyneuropathy.  He initially presented to the [facility].  The patient was advised to go to the ER for further evaluation.  In the ER, afebrile with no leukocytosis.  Left foot x-ray revealed soft tissue swelling with destructive appearance to the phalanges of the fifth digit particularly mid and distal.  Possibilities would include infection or osteomyelitis.    Empiric IV antibiotics, IV vancomycin was initiated in the ED.  EDP consulted podiatry to assist with the management of suspected left fifth toe osteomyelitis.  TRH, hospitalist service, was asked to admit.  Left foot MRI ordered and is pending.  ED Course: Temperature 98.5.  BP 132/89, pulse 75, respiratory 15, O2 saturation 93% on room air.  Lab studies notable for WBC 9.3, hemoglobin 11.0, platelet count 507.  Sodium today 134.  Serum bicarb 19, glucose 198, BUN 64, creatinine 3.72.  Review of Systems: Review of systems as noted in the HPI. All other systems reviewed and are negative.   Past Medical History:  Diagnosis Date   Adenomatous colon polyp 2007    Chronic kidney disease (CKD), stage III (moderate) (HCC)    Coronary artery disease    drug-eluting stent mid LAD March 2012[place] [facility]    Coronary artery disease    Diabetes (HCC)    diabetes mellitus, type II/chronic kidney disease/PVD on exam and ABI   Epigastric pain    H. pylori antibody positive treated 2007   Erectile dysfunction    Hyperlipidemia    Hypertension    Pain    chronic right ankle pain since [place]   Peripheral vascular disease (HCC)    AB I. January 2014 right anterior tibial artery disease   Sciatica    Right leg sciatica   Past Surgical History:  Procedure Laterality Date   ABDOMINAL AORTOGRAM W/LOWER EXTREMITY N/A 03/20/2020   Procedure: ABDOMINAL AORTOGRAM W/LOWER EXTREMITY;  Surgeon: Runell Gess, MD;  Location: [facility] INVASIVE CV LAB;  Service: Cardiovascular;  Laterality: N/A;   COLONOSCOPY     colonoscopy with polyp resection    COLONOSCOPY WITH PROPOFOL N/A 01/14/2017   Procedure: COLONOSCOPY WITH PROPOFOL;  Surgeon: Charolett Bumpers, MD;  Location: [facility] ENDOSCOPY;  Service: Endoscopy;  Laterality: N/A;   LOWER EXTREMITY ANGIOGRAPHY N/A 04/17/2020   Procedure: LOWER EXTREMITY ANGIOGRAPHY;  Surgeon: Runell Gess, MD;  Location: [facility] INVASIVE CV LAB;  Service: Cardiovascular;  Laterality: N/A;   PERIPHERAL VASCULAR ATHERECTOMY  04/17/2020   Procedure: PERIPHERAL VASCULAR ATHERECTOMY;  Surgeon: Runell Gess, MD;  Location: [facility] INVASIVE CV LAB;  Service: Cardiovascular;;  left popliteal and left SFA   PERIPHERAL VASCULAR BALLOON ANGIOPLASTY  04/17/2020  Procedure: PERIPHERAL VASCULAR BALLOON ANGIOPLASTY;  Surgeon: Runell Gess, MD;  Location: [facility] INVASIVE CV LAB;  Service: Cardiovascular;;  DCB Left popliteal and LEft SFA   PERIPHERAL VASCULAR INTERVENTION Right 03/20/2020   Procedure: PERIPHERAL VASCULAR INTERVENTION;  Surgeon: Runell Gess, MD;  Location: [facility] INVASIVE CV LAB;  Service: Cardiovascular;  Laterality: Right;     Social History:  reports that he quit smoking about 17 years ago. His smoking use included cigarettes. He has never used smokeless tobacco. He reports that he does not drink alcohol and does not use drugs.   Allergies  Allergen Reactions   Januvia [Sitagliptin]     pancreatitis   Ozempic (0.25 Or 0.5 Mg-Dose) [Semaglutide(0.25 Or 0.5mg -Dos)]     Other reaction(s): abdominal pain    Family History  Problem Relation Age of Onset   Diabetes Sister    Bone cancer Brother       Prior to Admission medications   Medication Sig Start Date End Date Taking? Authorizing Provider  aspirin 81 MG tablet Take 81 mg by mouth at bedtime.     [provider]  atorvastatin (LIPITOR) 80 MG tablet Take 40 mg by mouth at bedtime.    [provider]  Carboxymethylcellulose Sodium (THERATEARS OP) Place 1 drop into both eyes daily as needed (dry eyes).    [provider]  Cholecalciferol (VITAMIN D) 50 MCG (2000 UT) tablet Take 4,000 Units by mouth daily.    [provider]  clopidogrel (PLAVIX) 75 MG tablet 1 tablet Patient not taking: Reported on 11/29/2021    [provider]  Emollient (EUCERIN EX) Apply 1 application topically daily.     [provider]  empagliflozin (JARDIANCE) 25 MG TABS tablet Take 12.5 mg by mouth daily. Patient not taking: Reported on 11/29/2021    [provider]  gabapentin (NEURONTIN) 100 MG capsule 1 capsule 07/17/21   [provider]  insulin aspart protamine- aspart (NOVOLOG MIX 70/30) (70-30) 100 UNIT/ML injection Inject 70 Units into the skin 2 (two) times daily before a meal.     [provider]  lisinopril (PRINIVIL,ZESTRIL) 40 MG tablet Take 40 mg by mouth at bedtime.     [provider]  metFORMIN (GLUCOPHAGE) 1000 MG tablet Take 1,000 mg by mouth 2 (two) times daily with a meal.    [provider]  metoprolol succinate (TOPROL-XL) 25 MG 24 hr tablet TAKE ONE-HALF TABLET  BY MOUTH ONCE DAILY FOR BLOOD PRESSURE 10/30/21   [provider]  NON FORMULARY Insulin Pen Needle 31G X 8 MM Miscellaneous as directed    [provider]  sildenafil (VIAGRA) 100 MG tablet Take 100 mg by mouth daily as needed for erectile dysfunction.    [provider]    Physical Exam: BP (!) 185/86   Pulse 71   Temp 97.7 F (36.5 C) (Oral)   Resp 20   Ht 5\' 9"  (1.753 m)   Wt 115.7 kg   SpO2 100%   BMI 37.66 kg/m   General: 78 y.o. year-old male well developed well nourished in no acute distress.  Alert and oriented x3. Cardiovascular: Regular rate and rhythm with no rubs or gallops.  No thyromegaly or JVD noted.  Trace lower extremity edema bilaterally.  Respiratory: Clear to auscultation with no wheezes or rales. Good inspiratory effort. Abdomen: Soft nontender nondistended with normal bowel sounds x4 quadrants. Muskuloskeletal: No cyanosis or clubbing noted bilaterally Neuro: CN II-XII intact, strength, sensation, reflexes Skin: Left fifth  toe erythematous, edematous Psychiatry: Judgement and insight appear normal. Mood is appropriate for condition and setting          Labs on Admission:  Basic Metabolic Panel: Recent Labs  Lab 12/19/23 1215  NA 134*  K 5.1  CL 105  CO2 19*  GLUCOSE 198*  BUN 64*  CREATININE 3.72*  CALCIUM 8.7*   Liver Function Tests: Recent Labs  Lab 12/19/23 1215  AST 14*  ALT 16  ALKPHOS 70  BILITOT 0.7  PROT 7.2  ALBUMIN 3.1*   No results for input(s): "LIPASE", "AMYLASE" in the last 168 hours. No results for input(s): "AMMONIA" in the last 168 hours. CBC: Recent Labs  Lab 12/19/23 1215  WBC 9.3  NEUTROABS 7.4  HGB 11.0*  HCT 35.3*  MCV 81.9  PLT 507*   Cardiac Enzymes: No results for input(s): "CKTOTAL", "CKMB", "CKMBINDEX", "TROPONINI" in the last 168 hours.  BNP (last 3 results) No results for input(s): "BNP" in the last 8760 hours.  ProBNP (last 3 results) No results for input(s):  "PROBNP" in the last 8760 hours.  CBG: No results for input(s): "GLUCAP" in the last 168 hours.  Radiological Exams on Admission: DG Toe 5th Left Result Date: 12/19/2023 CLINICAL DATA:  Diabetic ulcer. EXAM: DG TOE 5TH LEFT three views COMPARISON:  None Available. FINDINGS: Soft tissue swelling about the fifth digit with erosive destructive changes involving the middle phalanx, distal proximal phalanx and base of the distal phalanx worrisome for osteomyelitis. There is also some fragmentation. A component of injury is possible. No additional fracture or dislocation. Degenerative changes. IMPRESSION: Soft tissue swelling with destructive appearance to the phalanges of the fifth digit particularly mid and distal. Possibilities would include infection or osteomyelitis. If there is further concern of the sequela and extent of distribution, additional cross-sectional imaging with MRI or bone scan could be considered as clinically appropriate Electronically Signed   By: Karen Kays M.D.   On: 12/19/2023 13:08    EKG: I independently viewed the EKG done and my findings are as followed: None available at the time of this visit.  Assessment/Plan Present on Admission:  Toe infection  Principal Problem:   Toe infection  Left fifth toe infection, POA Left foot x-ray with concern for left fifth toe osteomyelitis. MRI left foot ordered to confirm osteomyelitis. Received 1 dose of IV vancomycin in the ED. Will switch antibiotics to IV linezolid due to acute renal insufficiency. Add Rocephin to broaden coverage Monitor fever curve and WBC Follow peripheral blood cultures x 2. Follow CRP and sed rate.  AKI on CKD 3B, likely prerenal in the setting of dehydration from poor oral intake. Baseline creatinine appears to be 1.4  Presented with creatinine of 3.72 with GFR of 16 Avoid nephrotoxic agents, dehydration, and hypotension. Monitor urine output. Continue gentle IV fluid hydration. Repeat renal  function panel in the morning.  Type 2 diabetes with hyperglycemia Obtain hemoglobin A1c Heart healthy carb modified diet Start insulin sliding scale  Diabetic polyneuropathy Resume home gabapentin. Fall precautions.  Non anion gap metabolic acidosis in the setting of acute renal insufficiency Serum bicarb 19, anion gap 10 Gentle IV fluid hydration Repeat renal function panel in the morning.  Coronary artery disease status post PCI with stent placement No anginal symptoms reported. Restart home antiplatelets after verification from pharmacy and if ok with podiatry and home statin  Obesity BMI 37 Recommend weight loss outpatient with regular physical activity and healthy dieting.  Hypertension BP is not at  goal, elevated Resume home Norvasc and metoprolol succinate Hold off home lisinopril and chlorthalidone due to AKI Closely monitor vital signs  Hyperlipidemia Resume home Lipitor.   Critical care time: 65 minutes    DVT prophylaxis: Subcu heparin 3 times daily.  Code Status: DNR as stated by the patient himself who is alert and oriented x 3.  Family Communication: Updated the patient's niece at bedside.  Disposition Plan: Admitted to telemetry surgical unit.  Consults called: Podiatry consulted by EDP.  Admission status: Inpatient status.   Status is: Inpatient The patient requires at least 2 midnights for further evaluation and treatment of present condition.   Darlin Drop MD [facility] Pager [phone]  If 7PM-7AM, please contact night-coverage [email or web address] Password [email or web address]  12/19/2023, 8:30 PM

## 2023-12-19 NOTE — ED Provider Notes (Signed)
[place] EMERGENCY DEPARTMENT AT [facility] Provider Note   CSN: [id number] Arrival date & time: 12/19/23  1148     History  Chief Complaint  Patient presents with   Toe Pain    Kyle Delgado is a 78 y.o. male.  Patient sent over from the [place] clinic in [place].  About a week ago patient was trying to trim a corn on the lateral aspect of his left little toe.  It became infected and started to swell.  Patient without any fevers.  [facility] concerned [facility] for evaluation.  Patient is not on blood thinners but does have a history of diabetes.  Also history of coronary disease hypertension hyperlipidemia chronic kidney disease stage III.  Patient has not had an infection in his feet before.  Patient is a former user of tobacco products quit in 2008.       Home Medications Prior to Admission medications   Medication Sig Start Date End Date Taking? Authorizing Provider  aspirin 81 MG tablet Take 81 mg by mouth at bedtime.     [provider]  atorvastatin (LIPITOR) 80 MG tablet Take 40 mg by mouth at bedtime.    [provider]  Carboxymethylcellulose Sodium (THERATEARS OP) Place 1 drop into both eyes daily as needed (dry eyes).    [provider]  Cholecalciferol (VITAMIN D) 50 MCG (2000 UT) tablet Take 4,000 Units by mouth daily.    [provider]  clopidogrel (PLAVIX) 75 MG tablet 1 tablet Patient not taking: Reported on 11/29/2021    [provider]  Emollient (EUCERIN EX) Apply 1 application topically daily.     [provider]  empagliflozin (JARDIANCE) 25 MG TABS tablet Take 12.5 mg by mouth daily. Patient not taking: Reported on 11/29/2021    [provider]  gabapentin (NEURONTIN) 100 MG capsule 1 capsule 07/17/21   [provider]  insulin aspart protamine- aspart (NOVOLOG MIX 70/30) (70-30) 100 UNIT/ML injection Inject 70 Units into the skin 2 (two) times daily before a  meal.     [provider]  lisinopril (PRINIVIL,ZESTRIL) 40 MG tablet Take 40 mg by mouth at bedtime.     [provider]  metFORMIN (GLUCOPHAGE) 1000 MG tablet Take 1,000 mg by mouth 2 (two) times daily with a meal.    [provider]  metoprolol succinate (TOPROL-XL) 25 MG 24 hr tablet TAKE ONE-HALF TABLET BY MOUTH ONCE DAILY FOR BLOOD PRESSURE 10/30/21   [provider]  NON FORMULARY Insulin Pen Needle 31G X 8 MM Miscellaneous as directed    [provider]  sildenafil (VIAGRA) 100 MG tablet Take 100 mg by mouth daily as needed for erectile dysfunction.    [provider]      Allergies    Januvia [sitagliptin] and Ozempic (0.25 or 0.5 mg-dose) [semaglutide(0.25 or 0.5mg -dos)]    Review of Systems   Review of Systems  Constitutional:  Negative for chills and fever.  HENT:  Negative for ear pain and sore throat.   Eyes:  Negative for pain and visual disturbance.  Respiratory:  Negative for cough and shortness of breath.   Cardiovascular:  Negative for chest pain and palpitations.  Gastrointestinal:  Negative for abdominal pain and vomiting.  Genitourinary:  Negative for dysuria and hematuria.  Musculoskeletal:  Positive for joint swelling. Negative for arthralgias and back pain.  Skin:  Negative for color change and rash.  Neurological:  Negative for seizures and  syncope.  All other systems reviewed and are negative.   Physical Exam Updated Vital Signs BP (!) 169/73 (BP Location: Left Arm)   Pulse 66   Temp 97.7 F (36.5 C) (Oral)   Resp 20   Ht 1.753 m (5\' 9" )   Wt 115.7 kg   SpO2 97%   BMI 37.66 kg/m  Physical Exam Vitals and nursing note reviewed.  Constitutional:      General: He is not in acute distress.    Appearance: Normal appearance. He is well-developed.  HENT:     Head: Normocephalic and atraumatic.     Mouth/Throat:     Mouth: Mucous membranes are moist.  Eyes:     Extraocular Movements: Extraocular  movements intact.     Conjunctiva/sclera: Conjunctivae normal.     Pupils: Pupils are equal, round, and reactive to light.  Cardiovascular:     Rate and Rhythm: Normal rate and regular rhythm.     Heart sounds: No murmur heard. Pulmonary:     Effort: Pulmonary effort is normal. No respiratory distress.     Breath sounds: Normal breath sounds.  Abdominal:     Palpations: Abdomen is soft.     Tenderness: There is no abdominal tenderness.  Musculoskeletal:        General: Tenderness present. No swelling.     Cervical back: Normal range of motion and neck supple. No rigidity.     Left lower leg: Edema present.     Comments: Patient with swelling to the left foot the left little toe somewhat macerated.  Appears to be with infection there are some erythema onto the forefoot.  With some mild tenderness.  No erythema tracking up into the leg area.  Good cap refill to the toes.  Not able to assess the little toe dorsalis pedis pulse 1+.  Skin:    General: Skin is warm and dry.     Capillary Refill: Capillary refill takes less than 2 seconds.  Neurological:     General: No focal deficit present.     Mental Status: He is alert and oriented to person, place, and time.  Psychiatric:        Mood and Affect: Mood normal.     ED Results / Procedures / Treatments   Labs (all labs ordered are listed, but only abnormal results are displayed) Labs Reviewed  CBC WITH DIFFERENTIAL/PLATELET - Abnormal; Notable for the following components:      Result Value   Hemoglobin 11.0 (*)    HCT 35.3 (*)    MCH 25.5 (*)    Platelets 507 (*)    All other components within normal limits  COMPREHENSIVE METABOLIC PANEL - Abnormal; Notable for the following components:   Sodium 134 (*)    CO2 19 (*)    Glucose, Bld 198 (*)    BUN 64 (*)    Creatinine, Ser 3.72 (*)    Calcium 8.7 (*)    Albumin 3.1 (*)    AST 14 (*)    GFR, Estimated 16 (*)    All other components within normal limits  I-STAT CG4 LACTIC  ACID, ED    EKG None  Radiology DG Toe 5th Left Result Date: 12/19/2023 CLINICAL DATA:  Diabetic ulcer. EXAM: DG TOE 5TH LEFT three views COMPARISON:  None Available. FINDINGS: Soft tissue swelling about the fifth digit with erosive destructive changes involving the middle phalanx, distal proximal phalanx and base of the distal phalanx worrisome for osteomyelitis. There is also some  fragmentation. A component of injury is possible. No additional fracture or dislocation. Degenerative changes. IMPRESSION: Soft tissue swelling with destructive appearance to the phalanges of the fifth digit particularly mid and distal. Possibilities would include infection or osteomyelitis. If there is further concern of the sequela and extent of distribution, additional cross-sectional imaging with MRI or bone scan could be considered as clinically appropriate Electronically Signed   By: Karen Kays M.D.   On: 12/19/2023 13:08    Procedures Procedures    Medications Ordered in ED Medications - No data to display  ED Course/ Medical Decision Making/ A&P                                 Medical Decision Making  Patient's lactic acid 0.6 white count is normal at 9.3 hemoglobin 11 platelets 507.  Complete metabolic panel significant for creatinine 3.72 for a GFR of 16 which very well could be worsening renal function as well.  CO2 is 19 potassium 5.1 anion gap normal.  X-ray of just the left toe shows soft tissue swelling with destructive appearance of the phalanges of the fifth digit particularly mid and distal possibility would include infection or osteomyelitis.  Will start IV antibiotics feel the patient probably needs admission and some IV hydration based on the renal function.   Final Clinical Impression(s) / ED Diagnoses Final diagnoses:  Toe infection  Osteomyelitis of left foot, unspecified type (HCC)  Acute kidney injury [facility])    Rx / DC Orders ED Discharge Orders     None          Vanetta Mulders, MD 12/19/23 [phone]

## 2023-12-20 DIAGNOSIS — L089 Local infection of the skin and subcutaneous tissue, unspecified: Secondary | ICD-10-CM | POA: Diagnosis not present

## 2023-12-20 LAB — HEMOGLOBIN A1C
Hgb A1c MFr Bld: 9 % — ABNORMAL HIGH (ref 4.8–5.6)
Mean Plasma Glucose: 211.6 mg/dL

## 2023-12-20 LAB — C-REACTIVE PROTEIN: CRP: 1.2 mg/dL — ABNORMAL HIGH (ref <1.0)

## 2023-12-20 LAB — RENAL FUNCTION PANEL
Albumin: 2.5 g/dL — ABNORMAL LOW (ref 3.5–5.0)
Anion gap: 9 (ref 5–15)
BUN: 55 mg/dL — ABNORMAL HIGH (ref 8–23)
CO2: 20 mmol/L — ABNORMAL LOW (ref 22–32)
Calcium: 8.2 mg/dL — ABNORMAL LOW (ref 8.9–10.3)
Chloride: 108 mmol/L (ref 98–111)
Creatinine, Ser: 3.27 mg/dL — ABNORMAL HIGH (ref 0.61–1.24)
GFR, Estimated: 19 mL/min — ABNORMAL LOW (ref >60)
Glucose, Bld: 243 mg/dL — ABNORMAL HIGH (ref 70–99)
Phosphorus: 4 mg/dL (ref 2.5–4.6)
Potassium: 4.4 mmol/L (ref 3.5–5.1)
Sodium: 137 mmol/L (ref 135–145)

## 2023-12-20 LAB — CBC
HCT: 35.4 % — ABNORMAL LOW (ref 39.0–52.0)
Hemoglobin: 11.1 g/dL — ABNORMAL LOW (ref 13.0–17.0)
MCH: 25.3 pg — ABNORMAL LOW (ref 26.0–34.0)
MCHC: 31.4 g/dL (ref 30.0–36.0)
MCV: 80.8 fL (ref 80.0–100.0)
Platelets: 454 10*3/uL — ABNORMAL HIGH (ref 150–400)
RBC: 4.38 MIL/uL (ref 4.22–5.81)
RDW: 13.1 % (ref 11.5–15.5)
WBC: 6.1 10*3/uL (ref 4.0–10.5)
nRBC: 0 % (ref 0.0–0.2)

## 2023-12-20 LAB — GLUCOSE, CAPILLARY
Glucose-Capillary: 131 mg/dL — ABNORMAL HIGH (ref 70–99)
Glucose-Capillary: 227 mg/dL — ABNORMAL HIGH (ref 70–99)
Glucose-Capillary: 145 mg/dL — ABNORMAL HIGH (ref 70–99)
Glucose-Capillary: 134 mg/dL — ABNORMAL HIGH (ref 70–99)
Glucose-Capillary: 207 mg/dL — ABNORMAL HIGH (ref 70–99)

## 2023-12-20 LAB — MAGNESIUM: Magnesium: 1.7 mg/dL (ref 1.7–2.4)

## 2023-12-20 LAB — SEDIMENTATION RATE: Sed Rate: 55 mm/h — ABNORMAL HIGH (ref 0–16)

## 2023-12-20 LAB — SURGICAL PCR SCREEN
MRSA, PCR: NEGATIVE
Staphylococcus aureus: NEGATIVE

## 2023-12-20 MED ORDER — SODIUM CHLORIDE 0.9 % IV SOLN: INTRAVENOUS | Status: DC

## 2023-12-20 MED ORDER — POVIDONE-IODINE 10 % EX SWAB: 2.0000 | Freq: Once | CUTANEOUS | Status: DC

## 2023-12-20 MED ORDER — CHLORHEXIDINE GLUCONATE 4 % EX SOLN
60.0000 mL | Freq: Once | CUTANEOUS | Status: AC
  Administered 2023-12-21: 4 via TOPICAL
  Filled 2023-12-20: qty 60

## 2023-12-20 MED ORDER — CEFAZOLIN SODIUM-DEXTROSE 2-4 GM/100ML-% IV SOLN
2.0000 g | INTRAVENOUS | Status: AC
Start: 2023-12-21 — End: 2023-12-22
  Administered 2023-12-21: 2 g via INTRAVENOUS
  Filled 2023-12-20: qty 100

## 2023-12-20 MED ORDER — AMLODIPINE BESYLATE 10 MG PO TABS
10.0000 mg | ORAL_TABLET | Freq: Every day | ORAL | Status: DC
  Administered 2023-12-20 – 2023-12-24 (×5): 10 mg via ORAL
  Filled 2023-12-20 (×5): qty 1

## 2023-12-20 MED ORDER — METRONIDAZOLE 500 MG/100ML IV SOLN
500.0000 mg | Freq: Two times a day (BID) | INTRAVENOUS | Status: DC
  Administered 2023-12-20 – 2023-12-21 (×4): 500 mg via INTRAVENOUS
  Filled 2023-12-20 (×4): qty 100

## 2023-12-20 NOTE — Progress Notes (Addendum)
PROGRESS NOTE    Kyle Delgado  JXB:[id number] DOB: 07-02-1946 DOA: 12/19/2023 PCP: Kirby Funk, MD (Inactive)   Brief Narrative:  78 y.o. male with medical history significant for uncontrolled type 2 diabetes, hypertension, hyperlipidemia, CKD 3B, obesity, coronary artery disease status post PCI with stent placement presented with left fifth toe infection with concern for osteomyelitis from [facility].  On presentation, left foot x-ray revealed soft tissue swelling with destructive appearance to the phalanges of the fifth digit particularly mid and distal.  He was started on IV antibiotics.    Assessment & Plan:   Left fifth toe infection with concern for osteomyelitis -Imaging as above.  Currently on broad-spectrum antibiotics.  Add Flagyl.  MRI of the foot is pending.  I have consulted orthopedics: Follow recommendations. -Follow cultures  AKI on CKD stage IIIb Acute metabolic acidosis -Possibly from dehydration and poor oral intake baseline creatinine of 1.4.  Presented with creatinine of 3.70.  Improving to 3.27 today.  Monitor.  Continue IV fluids.  Hyponatremia -Improved  Anemia of chronic disease From renal failure.  Hemoglobin stable but monitor intermittently  Thrombocytosis -Possibly reactive.  Monitor intermittently  Diabetes mellitus type 2 with hyperglycemia -A1c 9.  Continue CBGs with SSI  Hypertension  hyperlipidemia -Continue metoprolol and statin.  Lisinopril, chlorthalidone on hold.  Diabetic nephropathy -Continue gabapentin  CAD with history of PCI with stent placement -Currently stable.  Restart home antiplatelets once okay with orthopedics.  Continue statin  Obesity class II -Outpatient follow-up  DVT prophylaxis: Start SCDs.  Discontinue subcutaneous heparin in case patient needs surgical intervention Code Status: DNR Family Communication: None at bedside Disposition Plan: Status is: Inpatient Remains inpatient appropriate because: Of severity  of illness    Consultants: Orthopedics Procedures: None  Antimicrobials:  Anti-infectives (From admission, onward)    Start     Dose/Rate Route Frequency Ordered Stop   12/20/23 1900  linezolid (ZYVOX) IVPB 600 mg        600 mg 300 mL/hr over 60 Minutes Intravenous Every 12 hours 12/19/23 2043     12/19/23 2045  cefTRIAXone (ROCEPHIN) 2 g in sodium chloride 0.9 % 100 mL IVPB        2 g 200 mL/hr over 30 Minutes Intravenous Every 24 hours 12/19/23 2043     12/19/23 1915  vancomycin (VANCOCIN) 2,500 mg in sodium chloride 0.9 % 500 mL IVPB        2,500 mg 262.5 mL/hr over 120 Minutes Intravenous  Once 12/19/23 1911 12/19/23 2301   12/19/23 1900  vancomycin (VANCOCIN) IVPB 1000 mg/200 mL premix  Status:  Discontinued        1,000 mg 200 mL/hr over 60 Minutes Intravenous  Once 12/19/23 1851 12/19/23 1911        Subjective: Patient seen and examined at bedside.  Denies worsening shortness of breath, fever or vomiting.  Complains of some left foot pain.  Objective: Vitals:   12/20/23 0100 12/20/23 0145 12/20/23 0251 12/20/23 0333  BP: (!) 170/78 138/85  (!) 182/91  Pulse: 64 70  73  Resp: 16 14  17   Temp:   98.3 F (36.8 C) 97.8 F (36.6 C)  TempSrc:    Oral  SpO2: 93% 97%  100%  Weight:    112.6 kg  Height:    5\' 9"  (1.753 m)    Intake/Output Summary (Last 24 hours) at 12/20/2023 0820 Last data filed at 12/20/2023 0700 Gross per 24 hour  Intake 1772.74 ml  Output 500 ml  Net 1272.74 ml   Filed Weights   12/19/23 1202 12/20/23 0333  Weight: 115.7 kg 112.6 kg    Examination:  General exam: Appears calm and comfortable.  On room air. Respiratory system: Bilateral decreased breath sounds at bases Cardiovascular system: S1 & S2 heard, Rate controlled Gastrointestinal system: Abdomen is nondistended, soft and nontender. Normal bowel sounds heard. Extremities: No cyanosis, clubbing, edema  Central nervous system: Alert and oriented.  Slow to respond.  No focal  neurological deficits. Moving extremities Skin: Left fifth toe erythematous and edematous Psychiatry: Flat affect.  Not agitated.    Data Reviewed: I have personally reviewed following labs and imaging studies  CBC: Recent Labs  Lab 12/19/23 1215 12/20/23 0614  WBC 9.3 6.1  NEUTROABS 7.4  --   HGB 11.0* 11.1*  HCT 35.3* 35.4*  MCV 81.9 80.8  PLT 507* 454*   Basic Metabolic Panel: Recent Labs  Lab 12/19/23 1215 12/20/23 0614  NA 134* 137  K 5.1 4.4  CL 105 108  CO2 19* 20*  GLUCOSE 198* 243*  BUN 64* 55*  CREATININE 3.72* 3.27*  CALCIUM 8.7* 8.2*  MG  --  1.7  PHOS  --  4.0   GFR: Estimated Creatinine Clearance: 23.4 mL/min (A) (by C-G formula based on SCr of 3.27 mg/dL (H)). Liver Function Tests: Recent Labs  Lab 12/19/23 1215 12/20/23 0614  AST 14*  --   ALT 16  --   ALKPHOS 70  --   BILITOT 0.7  --   PROT 7.2  --   ALBUMIN 3.1* 2.5*   No results for input(s): "LIPASE", "AMYLASE" in the last 168 hours. No results for input(s): "AMMONIA" in the last 168 hours. Coagulation Profile: No results for input(s): "INR", "PROTIME" in the last 168 hours. Cardiac Enzymes: No results for input(s): "CKTOTAL", "CKMB", "CKMBINDEX", "TROPONINI" in the last 168 hours. BNP (last 3 results) No results for input(s): "PROBNP" in the last 8760 hours. HbA1C: Recent Labs    12/20/23 0614  HGBA1C 9.0*   CBG: Recent Labs  Lab 12/19/23 2240 12/20/23 0339 12/20/23 0721  GLUCAP 216* 131* 227*   Lipid Profile: No results for input(s): "CHOL", "HDL", "LDLCALC", "TRIG", "CHOLHDL", "LDLDIRECT" in the last 72 hours. Thyroid Function Tests: No results for input(s): "TSH", "T4TOTAL", "FREET4", "T3FREE", "THYROIDAB" in the last 72 hours. Anemia Panel: No results for input(s): "VITAMINB12", "FOLATE", "FERRITIN", "TIBC", "IRON", "RETICCTPCT" in the last 72 hours. Sepsis Labs: Recent Labs  Lab 12/19/23 1219  LATICACIDVEN 0.6    No results found for this or any previous  visit (from the past 240 hours).       Radiology Studies: DG Toe 5th Left Result Date: 12/19/2023 CLINICAL DATA:  Diabetic ulcer. EXAM: DG TOE 5TH LEFT three views COMPARISON:  None Available. FINDINGS: Soft tissue swelling about the fifth digit with erosive destructive changes involving the middle phalanx, distal proximal phalanx and base of the distal phalanx worrisome for osteomyelitis. There is also some fragmentation. A component of injury is possible. No additional fracture or dislocation. Degenerative changes. IMPRESSION: Soft tissue swelling with destructive appearance to the phalanges of the fifth digit particularly mid and distal. Possibilities would include infection or osteomyelitis. If there is further concern of the sequela and extent of distribution, additional cross-sectional imaging with MRI or bone scan could be considered as clinically appropriate Electronically Signed   By: Karen Kays M.D.   On: 12/19/2023 13:08        Scheduled Meds:  atorvastatin  40  mg Oral QHS   gabapentin  200 mg Oral QHS   heparin  5,000 Units Subcutaneous Q8H   insulin aspart  0-5 Units Subcutaneous QHS   insulin aspart  0-9 Units Subcutaneous TID WC   metoprolol succinate  12.5 mg Oral Daily   Continuous Infusions:  sodium chloride 100 mL/hr at 12/20/23 0211   cefTRIAXone (ROCEPHIN)  IV Stopped (12/20/23 0047)   linezolid (ZYVOX) IV            Glade Lloyd, MD [facility] 12/20/2023, 8:20 AM

## 2023-12-20 NOTE — Consult Note (Addendum)
Reason for Consult: Left fifth toe diabetic infection with osteomyelitis Referring Physician: Hospitalist  Kyle Delgado is an 78 y.o. male.  HPI: Patient was seen at the [facility] recently with complaints of fifth toe swelling and discharge and was sent to the emergency department.  He was admitted to the hospitalist team overnight.  Orthopedics was consulted due to concern for osteomyelitis given erosive changes within the fifth toe noted on imaging.  MRI scan was obtained.  Patient states he has had issues with this toe for a couple of weeks.  He does have some sensory deficits due to diabetes.  He has a podiatrist through the [place] that he has been following for nail care and foot checks.  Denies fevers or chills.  Denies pain.  Past Medical History:  Diagnosis Date   Adenomatous colon polyp 2007   Chronic kidney disease (CKD), stage III (moderate) (HCC)    Coronary artery disease    drug-eluting stent mid LAD March 2012, [facility]    Coronary artery disease    Diabetes (HCC)    diabetes mellitus, type II/chronic kidney disease/PVD on exam and ABI   Epigastric pain    H. pylori antibody positive treated 2007   Erectile dysfunction    Hyperlipidemia    Hypertension    Pain    chronic right ankle pain since [place]   Peripheral vascular disease (HCC)    AB I. January 2014 right anterior tibial artery disease   Sciatica    Right leg sciatica    Past Surgical History:  Procedure Laterality Date   ABDOMINAL AORTOGRAM W/LOWER EXTREMITY N/A 03/20/2020   Procedure: ABDOMINAL AORTOGRAM W/LOWER EXTREMITY;  Surgeon: Runell Gess, MD;  Location: [facility] INVASIVE CV LAB;  Service: Cardiovascular;  Laterality: N/A;   COLONOSCOPY     colonoscopy with polyp resection    COLONOSCOPY WITH PROPOFOL N/A 01/14/2017   Procedure: COLONOSCOPY WITH PROPOFOL;  Surgeon: Charolett Bumpers, MD;  Location: [facility] ENDOSCOPY;  Service: Endoscopy;  Laterality: N/A;   LOWER EXTREMITY ANGIOGRAPHY N/A 04/17/2020    Procedure: LOWER EXTREMITY ANGIOGRAPHY;  Surgeon: Runell Gess, MD;  Location: [facility];  Service: Cardiovascular;  Laterality: N/A;   PERIPHERAL VASCULAR ATHERECTOMY  04/17/2020   Procedure: PERIPHERAL VASCULAR ATHERECTOMY;  Surgeon: Runell Gess, MD;  Location: [facility] INVASIVE CV LAB;  Service: Cardiovascular;;  left popliteal and left SFA   PERIPHERAL VASCULAR BALLOON ANGIOPLASTY  04/17/2020   Procedure: PERIPHERAL VASCULAR BALLOON ANGIOPLASTY;  Surgeon: Runell Gess, MD;  Location: [facility];  Service: Cardiovascular;;  DCB Left popliteal and LEft SFA   PERIPHERAL VASCULAR INTERVENTION Right 03/20/2020   Procedure: PERIPHERAL VASCULAR INTERVENTION;  Surgeon: Runell Gess, MD;  Location: [facility];  Service: Cardiovascular;  Laterality: Right;    Family History  Problem Relation Age of Onset   Diabetes Sister    Bone cancer Brother     Social History:  reports that he quit smoking about 17 years ago. His smoking use included cigarettes. He has never used smokeless tobacco. He reports that he does not drink alcohol and does not use drugs.  Allergies:  Allergies  Allergen Reactions   Januvia [Sitagliptin] Other (See Comments)    pancreatitis   Ozempic (0.25 Or 0.5 Mg-Dose) [Semaglutide(0.25 Or 0.5mg -Dos)] Other (See Comments)    Other reaction(s): abdominal pain    Medications: I have reviewed the patient's current medications.  Results for orders placed or performed during the hospital  encounter of 12/19/23 (from the past 48 hours)  CBC with Differential     Status: Abnormal   Collection Time: 12/19/23 12:15 PM  Result Value Ref Range   WBC 9.3 4.0 - 10.5 K/uL   RBC 4.31 4.22 - 5.81 MIL/uL   Hemoglobin 11.0 (L) 13.0 - 17.0 g/dL   HCT 57.8 (L) 46.9 - 62.9 %   MCV 81.9 80.0 - 100.0 fL   MCH 25.5 (L) 26.0 - 34.0 pg   MCHC 31.2 30.0 - 36.0 g/dL   RDW 52.8 41.3 - 24.4 %   Platelets 507 (H) 150 - 400 K/uL   nRBC 0.0 0.0 - 0.2 %    Neutrophils Relative % 78 %   Neutro Abs 7.4 1.7 - 7.7 K/uL   Lymphocytes Relative 12 %   Lymphs Abs 1.1 0.7 - 4.0 K/uL   Monocytes Relative 7 %   Monocytes Absolute 0.6 0.1 - 1.0 K/uL   Eosinophils Relative 2 %   Eosinophils Absolute 0.2 0.0 - 0.5 K/uL   Basophils Relative 1 %   Basophils Absolute 0.1 0.0 - 0.1 K/uL   Immature Granulocytes 0 %   Abs Immature Granulocytes 0.02 0.00 - 0.07 K/uL    Comment: Performed at [facility], [address] [place], [place] [address]  Comprehensive metabolic panel     Status: Abnormal   Collection Time: 12/19/23 12:15 PM  Result Value Ref Range   Sodium 134 (L) 135 - 145 mmol/L   Potassium 5.1 3.5 - 5.1 mmol/L   Chloride 105 98 - 111 mmol/L   CO2 19 (L) 22 - 32 mmol/L   Glucose, Bld 198 (H) 70 - 99 mg/dL    Comment: Glucose reference range applies only to samples taken after fasting for at least 8 hours.   BUN 64 (H) 8 - 23 mg/dL   Creatinine, Ser 2.53 (H) 0.61 - 1.24 mg/dL   Calcium 8.7 (L) 8.9 - 10.3 mg/dL   Total Protein 7.2 6.5 - 8.1 g/dL   Albumin 3.1 (L) 3.5 - 5.0 g/dL   AST 14 (L) 15 - 41 U/L   ALT 16 0 - 44 U/L   Alkaline Phosphatase 70 38 - 126 U/L   Total Bilirubin 0.7 0.0 - 1.2 mg/dL   GFR, Estimated 16 (L) >60 mL/min    Comment: (NOTE) Calculated using the CKD-EPI Creatinine Equation (2021)    Anion gap 10 5 - 15    Comment: Performed at [facility], [address] [place], [place] [address]  I-Stat CG4 Lactic Acid     Status: None   Collection Time: 12/19/23 12:19 PM  Result Value Ref Range   Lactic Acid, Venous 0.6 0.5 - 1.9 mmol/L  Culture, blood (Routine X 2) w Reflex to ID Panel     Status: None (Preliminary result)   Collection Time: 12/19/23  6:51 PM   Specimen: BLOOD  Result Value Ref Range   Specimen Description BLOOD SITE NOT SPECIFIED    Special Requests      BOTTLES DRAWN AEROBIC AND ANAEROBIC Blood Culture results may not be optimal due to an inadequate volume of blood received in culture  bottles   Culture      NO GROWTH < 24 HOURS Performed at [facility], [address], [place], [place] [address]    Report Status PENDING   Culture, blood (Routine X 2) w Reflex to ID Panel     Status: None (Preliminary result)  Collection Time: 12/19/23  7:05 PM   Specimen: BLOOD  Result Value Ref Range   Specimen Description BLOOD SITE NOT SPECIFIED    Special Requests      BOTTLES DRAWN AEROBIC AND ANAEROBIC Blood Culture results may not be optimal due to an inadequate volume of blood received in culture bottles   Culture      NO GROWTH < 12 HOURS Performed at [facility], [address] [place], [place] [address]    Report Status PENDING   CBG monitoring, ED     Status: Abnormal   Collection Time: 12/19/23 10:40 PM  Result Value Ref Range   Glucose-Capillary 216 (H) 70 - 99 mg/dL    Comment: Glucose reference range applies only to samples taken after fasting for at least 8 hours.  Glucose, capillary     Status: Abnormal   Collection Time: 12/20/23  3:39 AM  Result Value Ref Range   Glucose-Capillary 131 (H) 70 - 99 mg/dL    Comment: Glucose reference range applies only to samples taken after fasting for at least 8 hours.  Hemoglobin A1c     Status: Abnormal   Collection Time: 12/20/23  6:14 AM  Result Value Ref Range   Hgb A1c MFr Bld 9.0 (H) 4.8 - 5.6 %    Comment: (NOTE) Pre diabetes:          5.7%-6.4%  Diabetes:              >6.4%  Glycemic control for   <7.0% adults with diabetes    Mean Plasma Glucose 211.6 mg/dL    Comment: Performed at [facility], [address] [place], [place] [address]  Sedimentation rate     Status: Abnormal   Collection Time: 12/20/23  6:14 AM  Result Value Ref Range   Sed Rate 55 (H) 0 - 16 mm/hr    Comment: Performed at [facility], [address] [place], [place] [address]  C-reactive protein     Status: Abnormal   Collection Time: 12/20/23  6:14 AM  Result Value Ref Range   CRP 1.2 (H) <1.0 mg/dL     Comment: Performed at [facility], [address], [place], [place] [address]  CBC     Status: Abnormal   Collection Time: 12/20/23  6:14 AM  Result Value Ref Range   WBC 6.1 4.0 - 10.5 K/uL   RBC 4.38 4.22 - 5.81 MIL/uL   Hemoglobin 11.1 (L) 13.0 - 17.0 g/dL   HCT 52.8 (L) 41.3 - 24.4 %   MCV 80.8 80.0 - 100.0 fL   MCH 25.3 (L) 26.0 - 34.0 pg   MCHC 31.4 30.0 - 36.0 g/dL   RDW 01.0 27.2 - 53.6 %   Platelets 454 (H) 150 - 400 K/uL   nRBC 0.0 0.0 - 0.2 %    Comment: Performed at [facility], [address] [place], [place] [address]  Renal function panel     Status: Abnormal   Collection Time: 12/20/23  6:14 AM  Result Value Ref Range   Sodium 137 135 - 145 mmol/L   Potassium 4.4 3.5 - 5.1 mmol/L   Chloride 108 98 - 111 mmol/L   CO2 20 (L) 22 - 32 mmol/L   Glucose, Bld 243 (H) 70 - 99 mg/dL    Comment: Glucose reference range applies only to samples taken after fasting for at least 8 hours.   BUN 55 (H) 8 -  23 mg/dL   Creatinine, Ser 1.61 (H) 0.61 - 1.24 mg/dL   Calcium 8.2 (L) 8.9 - 10.3 mg/dL   Phosphorus 4.0 2.5 - 4.6 mg/dL   Albumin 2.5 (L) 3.5 - 5.0 g/dL   GFR, Estimated 19 (L) >60 mL/min    Comment: (NOTE) Calculated using the CKD-EPI Creatinine Equation (2021)    Anion gap 9 5 - 15    Comment: Performed at [facility], [address], [place], [place] [address]  Magnesium     Status: None   Collection Time: 12/20/23  6:14 AM  Result Value Ref Range   Magnesium 1.7 1.7 - 2.4 mg/dL    Comment: Performed at [facility], [address], [place], [place] [address]  Glucose, capillary     Status: Abnormal   Collection Time: 12/20/23  7:21 AM  Result Value Ref Range   Glucose-Capillary 227 (H) 70 - 99 mg/dL    Comment: Glucose reference range applies only to samples taken after fasting for at least 8 hours.  Glucose, capillary     Status: Abnormal   Collection Time: 12/20/23 11:15 AM  Result Value Ref Range   Glucose-Capillary 145 (H) 70  - 99 mg/dL    Comment: Glucose reference range applies only to samples taken after fasting for at least 8 hours.    MR FOOT LEFT WO CONTRAST Result Date: 12/20/2023 CLINICAL DATA:  78 year old male with history of diabetes and ulceration. EXAM: MRI OF THE LEFT FOOT WITHOUT CONTRAST TECHNIQUE: Multiplanar, multisequence MR imaging of the left foot was performed. No intravenous contrast was administered. COMPARISON:  Same day radiographs of the left fifth toe dated 12/19/2023. FINDINGS: Bones/Joint/Cartilage There is cortical destruction of the fifth middle and distal phalanges as well as the distal half of fifth proximal phalanx with T2 hyperintense and confluent T1 hypointense marrow signal abnormality, compatible with acute osteomyelitis. The remainder of the bones demonstrate normal marrow signal. Mild-to-moderate first MTP joint space narrowing and diffuse interphalangeal joint space narrowing. Mild-to-moderate degenerative changes of the medial hallux sesamoid-first metatarsal articulation. The TMT joints are anatomically aligned with mild joint space narrowing. Trace fourth and fifth MTP joint effusions. Ligaments Collateral ligaments are intact.  Lisfranc ligament is intact. Muscles and Tendons Visualized flexor, peroneal and extensor compartment tendons are intact. No significant tenosynovitis. Muscles are within normal limits. Soft tissue Soft tissue ulceration with surrounding edema and cutaneous thickening at the lateral aspect of fifth digit, which tracts to the level of the underlying bone. No discrete loculated collection identified. Subcutaneous edema extending along the dorsal foot. IMPRESSION: 1. Findings compatible with acute osteomyelitis of the fifth digit, including the proximal, middle, and distal phalanges, with overlying soft tissue ulceration. No discrete loculated collection identified. 2. Trace fifth and fourth MTP joint effusions. 3. 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 Electronically Signed   By: Hart Robinsons M.D.   On: 12/20/2023 09:57   DG Toe 5th Left Result Date: 12/19/2023 CLINICAL DATA:  Diabetic ulcer. EXAM: DG TOE 5TH LEFT three views COMPARISON:  None Available. FINDINGS: Soft tissue swelling about the fifth digit with erosive destructive changes involving the middle phalanx, distal proximal phalanx and base of the distal phalanx worrisome for osteomyelitis. There is also some fragmentation. A component of injury is possible. No additional fracture or dislocation. Degenerative changes. IMPRESSION: Soft tissue swelling with destructive appearance to the phalanges of the fifth digit particularly mid and distal. Possibilities would include infection or osteomyelitis. If there  is further concern of the sequela and extent of distribution, additional cross-sectional imaging with MRI or bone scan could be considered as clinically appropriate Electronically Signed   By: Karen Kays M.D.   On: 12/19/2023 13:08    Review of Systems  Constitutional: Negative.   HENT: Negative.    Respiratory: Negative.    Cardiovascular: Negative.   Musculoskeletal:        Left foot swelling and wound  Skin:  Positive for wound.  Neurological:  Positive for numbness.  Psychiatric/Behavioral: Negative.    All other systems reviewed and are negative.  Blood pressure (!) 138/91, pulse 76, temperature 97.8 F (36.6 C), temperature source Oral, resp. rate 17, height 5\' 9"  (1.753 m), weight 112.6 kg, SpO2 98%. Physical Exam HENT:     Head: Atraumatic.     Mouth/Throat:     Mouth: Mucous membranes are dry.  Eyes:     Extraocular Movements: Extraocular movements intact.  Cardiovascular:     Rate and Rhythm: Normal rate.  Pulmonary:     Effort: Pulmonary effort is normal.  Abdominal:     General: Abdomen is flat.  Musculoskeletal:     Cervical back: Neck supple.     Comments: Left foot demonstrates fifth toe swelling.  Some dorsal foot swelling.  There are some  bleeding and maceration about the fifth toe within the webspace and plantarly.  Patient endorses sensation to light touch although diminished throughout the forefoot.  No other wounds or signs of infection noted.  Skin:    General: Skin is warm.  Neurological:     Mental Status: He is alert.     Sensory: Sensory deficit present.     Assessment/Plan: Patient has osteomyelitis of his left fifth toe with a joint effusion within the fourth MTP joint.  We had a lengthy conversation about his diagnosis, prognosis and treatment.  My recommendation will be for fifth toe MTP disarticulation with I&D of bursal abscess about the forefoot and likely fourth MTP arthrotomy.  He has had some improvement with the antibiotics however given the amount of infection noted within the bone I do not feel like salvage is possible.  He understands and would like to proceed.    Patient has been posted for surgery tomorrow a.m.  He is n.p.o. past midnight.    Terance Hart 12/20/2023, 2:46 PM

## 2023-12-20 NOTE — Plan of Care (Signed)

## 2023-12-20 NOTE — Plan of Care (Signed)

## 2023-12-21 ENCOUNTER — Inpatient Hospital Stay (HOSPITAL_COMMUNITY): Payer: No Typology Code available for payment source | Admitting: Anesthesiology

## 2023-12-21 ENCOUNTER — Encounter (HOSPITAL_COMMUNITY)
Admission: EM | Disposition: A | Discharge status: Home / Self Care | Payer: Self-pay | Source: Home / Self Care | Attending: Internal Medicine

## 2023-12-21 ENCOUNTER — Encounter (HOSPITAL_COMMUNITY): Payer: Self-pay | Admitting: Internal Medicine

## 2023-12-21 ENCOUNTER — Other Ambulatory Visit: Payer: Self-pay

## 2023-12-21 DIAGNOSIS — M869 Osteomyelitis, unspecified: Secondary | ICD-10-CM

## 2023-12-21 DIAGNOSIS — Z87891 Personal history of nicotine dependence: Secondary | ICD-10-CM

## 2023-12-21 DIAGNOSIS — I2510 Atherosclerotic heart disease of native coronary artery without angina pectoris: Secondary | ICD-10-CM | POA: Diagnosis not present

## 2023-12-21 DIAGNOSIS — L089 Local infection of the skin and subcutaneous tissue, unspecified: Secondary | ICD-10-CM | POA: Diagnosis not present

## 2023-12-21 DIAGNOSIS — E1169 Type 2 diabetes mellitus with other specified complication: Secondary | ICD-10-CM | POA: Diagnosis not present

## 2023-12-21 HISTORY — PX: AMPUTATION TOE: SHX6595

## 2023-12-21 LAB — BASIC METABOLIC PANEL
Anion gap: 10 (ref 5–15)
BUN: 45 mg/dL — ABNORMAL HIGH (ref 8–23)
CO2: 19 mmol/L — ABNORMAL LOW (ref 22–32)
Calcium: 8.4 mg/dL — ABNORMAL LOW (ref 8.9–10.3)
Chloride: 110 mmol/L (ref 98–111)
Creatinine, Ser: 3.21 mg/dL — ABNORMAL HIGH (ref 0.61–1.24)
GFR, Estimated: 19 mL/min — ABNORMAL LOW (ref >60)
Glucose, Bld: 154 mg/dL — ABNORMAL HIGH (ref 70–99)
Potassium: 4.4 mmol/L (ref 3.5–5.1)
Sodium: 139 mmol/L (ref 135–145)

## 2023-12-21 LAB — MAGNESIUM: Magnesium: 1.7 mg/dL (ref 1.7–2.4)

## 2023-12-21 LAB — CBC WITH DIFFERENTIAL/PLATELET
Abs Immature Granulocytes: 0.02 10*3/uL (ref 0.00–0.07)
Basophils Absolute: 0.1 10*3/uL (ref 0.0–0.1)
Basophils Relative: 1 %
Eosinophils Absolute: 0.3 10*3/uL (ref 0.0–0.5)
Eosinophils Relative: 6 %
HCT: 36.2 % — ABNORMAL LOW (ref 39.0–52.0)
Hemoglobin: 11.4 g/dL — ABNORMAL LOW (ref 13.0–17.0)
Immature Granulocytes: 0 %
Lymphocytes Relative: 22 %
Lymphs Abs: 1.2 10*3/uL (ref 0.7–4.0)
MCH: 25.5 pg — ABNORMAL LOW (ref 26.0–34.0)
MCHC: 31.5 g/dL (ref 30.0–36.0)
MCV: 81 fL (ref 80.0–100.0)
Monocytes Absolute: 0.6 10*3/uL (ref 0.1–1.0)
Monocytes Relative: 10 %
Neutro Abs: 3.5 10*3/uL (ref 1.7–7.7)
Neutrophils Relative %: 61 %
Platelets: 465 10*3/uL — ABNORMAL HIGH (ref 150–400)
RBC: 4.47 MIL/uL (ref 4.22–5.81)
RDW: 13.2 % (ref 11.5–15.5)
WBC: 5.7 10*3/uL (ref 4.0–10.5)
nRBC: 0 % (ref 0.0–0.2)

## 2023-12-21 LAB — GLUCOSE, CAPILLARY
Glucose-Capillary: 151 mg/dL — ABNORMAL HIGH (ref 70–99)
Glucose-Capillary: 178 mg/dL — ABNORMAL HIGH (ref 70–99)
Glucose-Capillary: 173 mg/dL — ABNORMAL HIGH (ref 70–99)
Glucose-Capillary: 151 mg/dL — ABNORMAL HIGH (ref 70–99)
Glucose-Capillary: 236 mg/dL — ABNORMAL HIGH (ref 70–99)
Glucose-Capillary: 260 mg/dL — ABNORMAL HIGH (ref 70–99)

## 2023-12-21 LAB — C-REACTIVE PROTEIN: CRP: 1.5 mg/dL — ABNORMAL HIGH (ref <1.0)

## 2023-12-21 SURGERY — AMPUTATION, TOE
Anesthesia: Monitor Anesthesia Care | Site: Toe | Laterality: Left

## 2023-12-21 MED ORDER — ORAL CARE MOUTH RINSE
15.0000 mL | Freq: Once | OROMUCOSAL | Status: AC
Start: 2023-12-21 — End: 2023-12-21

## 2023-12-21 MED ORDER — PROPOFOL 500 MG/50ML IV EMUL
INTRAVENOUS | Status: DC | PRN
  Administered 2023-12-21: 75 ug/kg/min via INTRAVENOUS

## 2023-12-21 MED ORDER — CHLORHEXIDINE GLUCONATE 0.12 % MT SOLN: 15.0000 mL | Freq: Once | OROMUCOSAL | Status: AC

## 2023-12-21 MED ORDER — INSULIN ASPART 100 UNIT/ML IJ SOLN: 0.0000 [IU] | INTRAMUSCULAR | Status: DC | PRN

## 2023-12-21 MED ORDER — FENTANYL CITRATE (PF) 100 MCG/2ML IJ SOLN: 25.0000 ug | INTRAMUSCULAR | Status: DC | PRN

## 2023-12-21 MED ORDER — DEXMEDETOMIDINE HCL IN NACL 200 MCG/50ML IV SOLN
INTRAVENOUS | Status: DC | PRN
  Administered 2023-12-21: 12 ug via INTRAVENOUS

## 2023-12-21 MED ORDER — ONDANSETRON HCL 4 MG/2ML IJ SOLN
4.0000 mg | Freq: Once | INTRAMUSCULAR | Status: DC | PRN
Start: 2023-12-21 — End: 2023-12-21

## 2023-12-21 MED ORDER — PROPOFOL 10 MG/ML IV BOLUS
INTRAVENOUS | Status: DC | PRN
  Administered 2023-12-21: 30 mg via INTRAVENOUS

## 2023-12-21 MED ORDER — LIDOCAINE HCL 1 % IJ SOLN
INTRAMUSCULAR | Status: AC
  Filled 2023-12-21: qty 20

## 2023-12-21 MED ORDER — ORAL CARE MOUTH RINSE: 15.0000 mL | OROMUCOSAL | Status: DC | PRN

## 2023-12-21 MED ORDER — CHLORHEXIDINE GLUCONATE 0.12 % MT SOLN
OROMUCOSAL | Status: AC
  Administered 2023-12-21: 15 mL via OROMUCOSAL
  Filled 2023-12-21: qty 15

## 2023-12-21 MED ORDER — ACETAMINOPHEN 10 MG/ML IV SOLN: 1000.0000 mg | Freq: Once | INTRAVENOUS | Status: DC | PRN

## 2023-12-21 MED ORDER — PROPOFOL 10 MG/ML IV BOLUS
INTRAVENOUS | Status: AC
  Filled 2023-12-21: qty 20

## 2023-12-21 MED ORDER — FENTANYL CITRATE (PF) 250 MCG/5ML IJ SOLN
INTRAMUSCULAR | Status: AC
  Filled 2023-12-21: qty 5

## 2023-12-21 MED ORDER — 0.9 % SODIUM CHLORIDE (POUR BTL) OPTIME
TOPICAL | Status: DC | PRN
  Administered 2023-12-21: 1000 mL

## 2023-12-21 MED ORDER — SODIUM CHLORIDE 0.9 % IV SOLN: INTRAVENOUS | Status: DC

## 2023-12-21 MED ORDER — LIDOCAINE HCL (PF) 1 % IJ SOLN
INTRAMUSCULAR | Status: DC | PRN
  Administered 2023-12-21: 20 mL

## 2023-12-21 SURGICAL SUPPLY — 32 items
BAG COUNTER SPONGE SURGICOUNT (BAG) IMPLANT
BNDG ELASTIC 4INX 5YD STR LF (GAUZE/BANDAGES/DRESSINGS) IMPLANT
BNDG ELASTIC 4X5.8 VLCR STR LF (GAUZE/BANDAGES/DRESSINGS) ×2 IMPLANT
BNDG ESMARK 4X9 LF (GAUZE/BANDAGES/DRESSINGS) IMPLANT
BNDG GAUZE DERMACEA FLUFF 4 (GAUZE/BANDAGES/DRESSINGS) ×2 IMPLANT
BNDG STRETCH GAUZE 3IN X12FT (GAUZE/BANDAGES/DRESSINGS) IMPLANT
COVER SURGICAL LIGHT HANDLE (MISCELLANEOUS) ×4 IMPLANT
DRAPE U-SHAPE 47X51 STRL (DRAPES) ×2 IMPLANT
DRSG XEROFORM 1X8 (GAUZE/BANDAGES/DRESSINGS) IMPLANT
DURAPREP 26ML APPLICATOR (WOUND CARE) ×2 IMPLANT
ELECT REM PT RETURN 9FT ADLT (ELECTROSURGICAL) ×1
ELECTRODE REM PT RTRN 9FT ADLT (ELECTROSURGICAL) ×2 IMPLANT
GAUZE SPONGE 4X4 12PLY STRL (GAUZE/BANDAGES/DRESSINGS) IMPLANT
GLOVE BIOGEL M STRL SZ7.5 (GLOVE) ×2 IMPLANT
GLOVE INDICATOR 8.0 STRL GRN (GLOVE) ×2 IMPLANT
GLOVE SRG 8 PF TXTR STRL LF DI (GLOVE) ×2 IMPLANT
GOWN STRL REUS W/ TWL LRG LVL3 (GOWN DISPOSABLE) ×2 IMPLANT
GOWN STRL REUS W/ TWL XL LVL3 (GOWN DISPOSABLE) ×2 IMPLANT
KIT BASIN OR (CUSTOM PROCEDURE TRAY) ×2 IMPLANT
KIT TURNOVER KIT B (KITS) ×2 IMPLANT
MANIFOLD NEPTUNE II (INSTRUMENTS) ×2 IMPLANT
NDL 22X1.5 STRL (OR ONLY) (MISCELLANEOUS) IMPLANT
NEEDLE 22X1.5 STRL (OR ONLY) (MISCELLANEOUS) ×1 IMPLANT
NS IRRIG 1000ML POUR BTL (IV SOLUTION) ×2 IMPLANT
PACK ORTHO EXTREMITY (CUSTOM PROCEDURE TRAY) ×2 IMPLANT
PAD ARMBOARD 7.5X6 YLW CONV (MISCELLANEOUS) ×4 IMPLANT
PAD CAST 4YDX4 CTTN HI CHSV (CAST SUPPLIES) IMPLANT
SUT ETHILON 2 0 FS 18 (SUTURE) ×2 IMPLANT
SYR CONTROL 10ML LL (SYRINGE) IMPLANT
TOWEL GREEN STERILE (TOWEL DISPOSABLE) ×2 IMPLANT
TUBE CONNECTING 20X1/4 (TUBING) ×2 IMPLANT
YANKAUER SUCT BULB TIP NO VENT (SUCTIONS) ×2 IMPLANT

## 2023-12-21 NOTE — Progress Notes (Signed)
PROGRESS NOTE    Kyle Delgado  ZOX:[id number] DOB: November 02, 1946 DOA: 12/19/2023 PCP: Kirby Funk, MD (Inactive)   Brief Narrative:  78 y.o. male with medical history significant for uncontrolled type 2 diabetes, hypertension, hyperlipidemia, CKD 3B, obesity, coronary artery disease status post PCI with stent placement presented with left fifth toe infection with concern for osteomyelitis from [facility].  On presentation, left foot x-ray revealed soft tissue swelling with destructive appearance to the phalanges of the fifth digit particularly mid and distal.  He was started on IV antibiotics.  Orthopedics was consulted.  MRI of left foot showed left fifth toe osteomyelitis.  Assessment & Plan:   Acute left fifth toe osteomyelitis -Currently on broad-spectrum antibiotics.   -Orthopedics following and planning for surgical intervention today. -Blood cultures negative so far.  AKI on CKD stage IIIb Acute metabolic acidosis -Possibly from dehydration and poor oral intake baseline creatinine of 1.4.  Presented with creatinine of 3.70.  Improving to 3.21 today.  Monitor.  Continue IV fluids.  Hyponatremia -Improved  Anemia of chronic disease -From renal failure.  Hemoglobin stable but monitor intermittently  Thrombocytosis -Possibly reactive.  Monitor intermittently  Diabetes mellitus type 2 with hyperglycemia -A1c 9.  Continue CBGs with SSI  Hypertension  hyperlipidemia -Continue amlodipine, metoprolol and statin.  Lisinopril, chlorthalidone on hold.  Diabetic nephropathy -Continue gabapentin  CAD with history of PCI with stent placement -Currently stable.  Restart home antiplatelets once okay with orthopedics.  Continue statin  Obesity class II -Outpatient follow-up  DVT prophylaxis: SCDs  code Status: DNR Family Communication: None at bedside Disposition Plan: Status is: Inpatient Remains inpatient appropriate because: Of severity of illness    Consultants:  Orthopedics Procedures: None  Antimicrobials:  Anti-infectives (From admission, onward)    Start     Dose/Rate Route Frequency Ordered Stop   12/21/23 0600  ceFAZolin (ANCEF) IVPB 2g/100 mL premix        2 g 200 mL/hr over 30 Minutes Intravenous On call to O.R. 12/20/23 1742 12/21/23 0730   12/20/23 1900  [MAR Hold]  linezolid (ZYVOX) IVPB 600 mg        (MAR Hold since Sun 12/21/2023 at 0631.Hold Reason: Transfer to a Procedural area)   600 mg 300 mL/hr over 60 Minutes Intravenous Every 12 hours 12/19/23 2043     12/20/23 0915  [MAR Hold]  metroNIDAZOLE (FLAGYL) IVPB 500 mg        (MAR Hold since Sun 12/21/2023 at 0631.Hold Reason: Transfer to a Procedural area)   500 mg 100 mL/hr over 60 Minutes Intravenous Every 12 hours 12/20/23 0828     12/19/23 2045  [MAR Hold]  cefTRIAXone (ROCEPHIN) 2 g in sodium chloride 0.9 % 100 mL IVPB        (MAR Hold since Sun 12/21/2023 at 0631.Hold Reason: Transfer to a Procedural area)   2 g 200 mL/hr over 30 Minutes Intravenous Every 24 hours 12/19/23 2043     12/19/23 1915  vancomycin (VANCOCIN) 2,500 mg in sodium chloride 0.9 % 500 mL IVPB        2,500 mg 262.5 mL/hr over 120 Minutes Intravenous  Once 12/19/23 1911 12/19/23 2301   12/19/23 1900  vancomycin (VANCOCIN) IVPB 1000 mg/200 mL premix  Status:  Discontinued        1,000 mg 200 mL/hr over 60 Minutes Intravenous  Once 12/19/23 1851 12/19/23 1911        Subjective: Patient seen and examined at bedside.  No fever, chest pain or shortness  of breath reported. Objective: Vitals:   12/21/23 0643 12/21/23 0807 12/21/23 0815 12/21/23 0822  BP:  (!) 164/79 (!) 176/78 (!) 173/86  Pulse:  73 70 70  Resp:  (!) 24 18 16   Temp:  97.8 F (36.6 C)  97.7 F (36.5 C)  TempSrc:      SpO2:  96% 95% 96%  Weight: 112.6 kg     Height: 5' 9.02" (1.753 m)       Intake/Output Summary (Last 24 hours) at 12/21/2023 0840 Last data filed at 12/21/2023 0802 Gross per 24 hour  Intake 3441.11 ml  Output 1200 ml   Net 2241.11 ml   Filed Weights   12/19/23 1202 12/20/23 0333 12/21/23 0643  Weight: 115.7 kg 112.6 kg 112.6 kg    Examination:  General: Remains on room air.  No distress ENT/neck: No thyromegaly.  JVD is not elevated  respiratory: Decreased breath sounds at bases bilaterally with some crackles; no wheezing  CVS: S1-S2 heard, rate controlled currently Abdominal: Soft, nontender, slightly distended; no organomegaly, bowel sounds are heard Extremities: Trace lower extremity edema; no cyanosis  CNS: Awake and alert.  No focal neurologic deficit.  Moves extremities Lymph: No obvious lymphadenopathy Skin: No ecchymosis/lesions noted psych: Showing no signs of agitation.  Affect is mostly flat. musculoskeletal: Left fifth toe erythema and edema present     Data Reviewed: I have personally reviewed following labs and imaging studies  CBC: Recent Labs  Lab 12/19/23 1215 12/20/23 0614 12/21/23 0605  WBC 9.3 6.1 5.7  NEUTROABS 7.4  --  3.5  HGB 11.0* 11.1* 11.4*  HCT 35.3* 35.4* 36.2*  MCV 81.9 80.8 81.0  PLT 507* 454* 465*   Basic Metabolic Panel: Recent Labs  Lab 12/19/23 1215 12/20/23 0614 12/21/23 0605  NA 134* 137 139  K 5.1 4.4 4.4  CL 105 108 110  CO2 19* 20* 19*  GLUCOSE 198* 243* 154*  BUN 64* 55* 45*  CREATININE 3.72* 3.27* 3.21*  CALCIUM 8.7* 8.2* 8.4*  MG  --  1.7 1.7  PHOS  --  4.0  --    GFR: Estimated Creatinine Clearance: 23.9 mL/min (A) (by C-G formula based on SCr of 3.21 mg/dL (H)). Liver Function Tests: Recent Labs  Lab 12/19/23 1215 12/20/23 0614  AST 14*  --   ALT 16  --   ALKPHOS 70  --   BILITOT 0.7  --   PROT 7.2  --   ALBUMIN 3.1* 2.5*   No results for input(s): "LIPASE", "AMYLASE" in the last 168 hours. No results for input(s): "AMMONIA" in the last 168 hours. Coagulation Profile: No results for input(s): "INR", "PROTIME" in the last 168 hours. Cardiac Enzymes: No results for input(s): "CKTOTAL", "CKMB", "CKMBINDEX",  "TROPONINI" in the last 168 hours. BNP (last 3 results) No results for input(s): "PROBNP" in the last 8760 hours. HbA1C: Recent Labs    12/20/23 0614  HGBA1C 9.0*   CBG: Recent Labs  Lab 12/20/23 1115 12/20/23 1615 12/20/23 2038 12/21/23 0613 12/21/23 0809  GLUCAP 145* 134* 207* 151* 178*   Lipid Profile: No results for input(s): "CHOL", "HDL", "LDLCALC", "TRIG", "CHOLHDL", "LDLDIRECT" in the last 72 hours. Thyroid Function Tests: No results for input(s): "TSH", "T4TOTAL", "FREET4", "T3FREE", "THYROIDAB" in the last 72 hours. Anemia Panel: No results for input(s): "VITAMINB12", "FOLATE", "FERRITIN", "TIBC", "IRON", "RETICCTPCT" in the last 72 hours. Sepsis Labs: Recent Labs  Lab 12/19/23 1219  LATICACIDVEN 0.6    Recent Results (from the past 240 hours)  Culture, blood (Routine X 2) w Reflex to ID Panel     Status: None (Preliminary result)   Collection Time: 12/19/23  6:51 PM   Specimen: BLOOD  Result Value Ref Range Status   Specimen Description BLOOD SITE NOT SPECIFIED  Final   Special Requests   Final    BOTTLES DRAWN AEROBIC AND ANAEROBIC Blood Culture results may not be optimal due to an inadequate volume of blood received in culture bottles   Culture   Final    NO GROWTH < 24 HOURS Performed at [facility], [address], [place], [place] [address]    Report Status PENDING  Incomplete  Culture, blood (Routine X 2) w Reflex to ID Panel     Status: None (Preliminary result)   Collection Time: 12/19/23  7:05 PM   Specimen: BLOOD  Result Value Ref Range Status   Specimen Description BLOOD SITE NOT SPECIFIED  Final   Special Requests   Final    BOTTLES DRAWN AEROBIC AND ANAEROBIC Blood Culture results may not be optimal due to an inadequate volume of blood received in culture bottles   Culture   Final    NO GROWTH < 12 HOURS Performed at [facility], [address] [place], [place] [address]    Report Status PENDING  Incomplete  Surgical pcr  screen     Status: None   Collection Time: 12/20/23  6:00 PM   Specimen: Nasal Mucosa; Nasal Swab  Result Value Ref Range Status   MRSA, PCR NEGATIVE NEGATIVE Final   Staphylococcus aureus NEGATIVE NEGATIVE Final    Comment: (NOTE) The Xpert SA Assay (FDA approved for NASAL specimens in patients 18 years of age and older), is one component of a comprehensive surveillance program. It is not intended to diagnose infection nor to guide or monitor treatment. Performed at [facility], [address] [place], [place] [address]          Radiology Studies: MR FOOT LEFT WO CONTRAST Result Date: 12/20/2023 CLINICAL DATA:  78 year old male with history of diabetes and ulceration. EXAM: MRI OF THE LEFT FOOT WITHOUT CONTRAST TECHNIQUE: Multiplanar, multisequence MR imaging of the left foot was performed. No intravenous contrast was administered. COMPARISON:  Same day radiographs of the left fifth toe dated 12/19/2023. FINDINGS: Bones/Joint/Cartilage There is cortical destruction of the fifth middle and distal phalanges as well as the distal half of fifth proximal phalanx with T2 hyperintense and confluent T1 hypointense marrow signal abnormality, compatible with acute osteomyelitis. The remainder of the bones demonstrate normal marrow signal. Mild-to-moderate first MTP joint space narrowing and diffuse interphalangeal joint space narrowing. Mild-to-moderate degenerative changes of the medial hallux sesamoid-first metatarsal articulation. The TMT joints are anatomically aligned with mild joint space narrowing. Trace fourth and fifth MTP joint effusions. Ligaments Collateral ligaments are intact.  Lisfranc ligament is intact. Muscles and Tendons Visualized flexor, peroneal and extensor compartment tendons are intact. No significant tenosynovitis. Muscles are within normal limits. Soft tissue Soft tissue ulceration with surrounding edema and cutaneous thickening at the lateral aspect of fifth digit,  which tracts to the level of the underlying bone. No discrete loculated collection identified. Subcutaneous edema extending along the dorsal foot. IMPRESSION: 1. Findings compatible with acute osteomyelitis of the fifth digit, including the proximal, middle, and distal phalanges, with overlying soft tissue ulceration. No discrete loculated collection identified. 2. Trace fifth and fourth MTP joint effusions. 3. Osteoarthritis, most pronounced at the first MTP joint. Electronically Signed  By: Hart Robinsons M.D.   On: 12/20/2023 09:57   DG Toe 5th Left Result Date: 12/19/2023 CLINICAL DATA:  Diabetic ulcer. EXAM: DG TOE 5TH LEFT three views COMPARISON:  None Available. FINDINGS: Soft tissue swelling about the fifth digit with erosive destructive changes involving the middle phalanx, distal proximal phalanx and base of the distal phalanx worrisome for osteomyelitis. There is also some fragmentation. A component of injury is possible. No additional fracture or dislocation. Degenerative changes. IMPRESSION: Soft tissue swelling with destructive appearance to the phalanges of the fifth digit particularly mid and distal. Possibilities would include infection or osteomyelitis. If there is further concern of the sequela and extent of distribution, additional cross-sectional imaging with MRI or bone scan could be considered as clinically appropriate Electronically Signed   By: Karen Kays M.D.   On: 12/19/2023 13:08        Scheduled Meds:  [MAR Hold] amLODipine  10 mg Oral Daily   [MAR Hold] atorvastatin  40 mg Oral QHS   [MAR Hold] gabapentin  200 mg Oral QHS   [MAR Hold] insulin aspart  0-5 Units Subcutaneous QHS   [MAR Hold] insulin aspart  0-9 Units Subcutaneous TID WC   [MAR Hold] metoprolol succinate  12.5 mg Oral Daily   povidone-iodine  2 Application Topical Once   Continuous Infusions:  sodium chloride 100 mL/hr at 12/21/23 0732   sodium chloride 10 mL/hr at 12/21/23 0645   acetaminophen      [MAR Hold] cefTRIAXone (ROCEPHIN)  IV 2 g (12/20/23 2129)   [MAR Hold] linezolid (ZYVOX) IV 600 mg (12/20/23 1805)   [MAR Hold] metronidazole 500 mg (12/20/23 2227)          Glade Lloyd, MD [facility] 12/21/2023, 8:40 AM

## 2023-12-21 NOTE — Progress Notes (Signed)
     Travaris Kosh is a 78 y.o. male   Orthopaedic diagnosis: Left fifth toe osteomyelitis with likely forefoot abscess and fourth MTP septic arthrosis  Subjective: Patient resting comfortably.  He was seen in the preop area.  He is ready for surgery.  No new complaints.  Objectyive: Vitals:   12/21/23 0419 12/21/23 0609  BP: (!) 172/76 (!) 166/87  Pulse: 76 70  Resp: 18 18  Temp: 98.3 F (36.8 C) (!) 97.5 F (36.4 C)  SpO2: 98% 98%     Exam: Awake and alert Respirations even and unlabored No acute distress  Left foot with some swelling.  Wound to the fifth toe with drainage.  Assessment: Left fifth toe osteomyelitis with likely forefoot abscess and fourth MTP septic arthrosis   Plan: Left fifth toe amputation likely through the MTP joint with incision and drainage of forefoot abscess and fourth MTP arthrotomy and drainage.  Patient understands.  His questions were answered.  We discussed the risk, benefits and alternatives which include wound healing complications, continued infection, need for further surgery.  He would like to proceed.   Nicki Guadalajara, MD

## 2023-12-21 NOTE — Anesthesia Postprocedure Evaluation (Signed)
Anesthesia Post Note  Patient: Kyle Delgado  Procedure(s) Performed: AMPUTATION  5th TOE (Left: Toe)     Patient location during evaluation: PACU Anesthesia Type: MAC Level of consciousness: awake Pain management: pain level controlled Vital Signs Assessment: post-procedure vital signs reviewed and stable Respiratory status: spontaneous breathing, nonlabored ventilation and respiratory function stable Cardiovascular status: blood pressure returned to baseline and stable Postop Assessment: no apparent nausea or vomiting Anesthetic complications: no   No notable events documented.  Last Vitals:  Vitals:   12/21/23 0843 12/21/23 1606  BP: (!) 176/80 (!) 190/85  Pulse: 72 81  Resp:    Temp: 36.4 C (!) 36.3 C  SpO2: 95% 98%    Last Pain:  Vitals:   12/21/23 1606  TempSrc: Oral  PainSc:                  [name] P [name]

## 2023-12-21 NOTE — Anesthesia Preprocedure Evaluation (Addendum)
Anesthesia Evaluation  Patient identified by MRN, date of birth, ID band Patient awake    Reviewed: Allergy & Precautions, NPO status , Patient's Chart, lab work & pertinent test results  Airway Mallampati: III  TM Distance: >3 FB Neck ROM: Full    Dental  (+) Edentulous Upper, Missing   Pulmonary sleep apnea , former smoker   Pulmonary exam normal        Cardiovascular hypertension, Pt. on medications and Pt. on home beta blockers + CAD, + Cardiac Stents and + Peripheral Vascular Disease  Normal cardiovascular exam     Neuro/Psych  Neuromuscular disease  negative psych ROS   GI/Hepatic negative GI ROS, Neg liver ROS,,,  Endo/Other  diabetes, Insulin Dependent, Oral Hypoglycemic Agents    Renal/GU Renal disease     Musculoskeletal   Abdominal  (+) + obese  Peds  Hematology  (+) Blood dyscrasia (Plavix), anemia   Anesthesia Other Findings left infected toe  Reproductive/Obstetrics                             Anesthesia Physical Anesthesia Plan  ASA: 3  Anesthesia Plan: MAC   Post-op Pain Management:    Induction:   PONV Risk Score and Plan: 1 and Ondansetron, Dexamethasone, Propofol infusion and Treatment may vary due to age or medical condition  Airway Management Planned: Simple Face Mask  Additional Equipment:   Intra-op Plan:   Post-operative Plan:   Informed Consent: I have reviewed the patients History and Physical, chart, labs and discussed the procedure including the risks, benefits and alternatives for the proposed anesthesia with the patient or authorized representative who has indicated his/her understanding and acceptance.   Patient has DNR.  Discussed DNR with patient and Suspend DNR.   Dental advisory given  Plan Discussed with: CRNA and Surgeon  Anesthesia Plan Comments:         Anesthesia Quick Evaluation

## 2023-12-21 NOTE — Plan of Care (Signed)
Problem: Education: Goal: Individualized Educational Video(s) Outcome: Progressing   Problem: Coping: Goal: Ability to adjust to condition or change in health will improve Outcome: Progressing   Problem: Fluid Volume: Goal: Ability to maintain a balanced intake and output will improve Outcome: Progressing   Problem: Health Behavior/Discharge Planning: Goal: Ability to identify and utilize available resources and services will improve Outcome: Progressing Goal: Ability to manage health-related needs will improve Outcome: Progressing   Problem: Metabolic: Goal: Ability to maintain appropriate glucose levels will improve Outcome: Progressing   Problem: Nutritional: Goal: Maintenance of adequate nutrition will improve Outcome: Progressing Goal: Progress toward achieving an optimal weight will improve Outcome: Progressing   Problem: Skin Integrity: Goal: Risk for impaired skin integrity will decrease Outcome: Progressing   Problem: Tissue Perfusion: Goal: Adequacy of tissue perfusion will improve Outcome: Progressing

## 2023-12-21 NOTE — Transfer of Care (Signed)
Immediate Anesthesia Transfer of Care Note  Patient: Kyle Delgado  Procedure(s) Performed: AMPUTATION  5th TOE (Left: Toe)  Patient Location: PACU  Anesthesia Type:MAC  Level of Consciousness: awake, alert , and oriented  Airway & Oxygen Therapy: Patient Spontanous Breathing  Post-op Assessment: Report given to RN and Post -op Vital signs reviewed and stable  Post vital signs: Reviewed and stable  Last Vitals:  Vitals Value Taken Time  BP 164/79 12/21/23 0806  Temp    Pulse 74 12/21/23 0809  Resp 17 12/21/23 0809  SpO2 97 % 12/21/23 0809  Vitals shown include unfiled device data.  Last Pain:  Vitals:   12/21/23 0609  TempSrc: Oral  PainSc: 0-No pain         Complications: No notable events documented.

## 2023-12-21 NOTE — Op Note (Signed)
Kyle Delgado male 78 y.o. 12/21/2023  PreOperative Diagnosis: Left fifth toe osteomyelitis Left forefoot abscess, multiple bursal spaces Left fourth MTP joint septic arthrosis  PostOperative Diagnosis: Same  PROCEDURE: Left fifth toe MTP disarticulation Incision and drainage left forefoot abscess, multiple bursal spaces Left fourth MTP arthrotomy  SURGEON: Dub Mikes, MD  ASSISTANT: None  ANESTHESIA: MAC with infiltration of 1% lidocaine for local anesthetic  FINDINGS: See below  IMPLANTS: None  INDICATIONS:77 y.o. male presented to the emergency department on advisement of his podiatrist for foot infection.  He states he been dealing with some drainage and swelling of his fifth toe for a couple of weeks.  X-ray and MRI showed bony erosions in the fifth toe consistent with osteomyelitis.  He did have mild improvement with antibiotics but had continued drainage and swelling and therefore was indicated for surgery.  MRI also indicated significant effusion of the fourth MTP joint consistent with septic arthrosis and fluid collection within the dorsal and plantar surface of the forefoot consistent with multiple bursal abscess.   patient understood the risks, benefits and alternatives to surgery which include but are not limited to wound healing complications, infection, nonunion, malunion, need for further surgery as well as damage to surrounding structures. They also understood the potential for continued pain in that there were no guarantees of acceptable outcome After weighing these risks the patient opted to proceed with surgery.  PROCEDURE: Patient was identified in the preoperative holding area.  The left foot was marked by myself.  Consent was signed by myself and the patient.  Patient was taken to the operative suite.  Bump and bone foam were used.  MAC anesthesia was induced.  Timeout was performed.  Left foot was prepped and 1% lidocaine was infiltrated around the  surgical site for local anesthetic purposes.  We then began by marking out the incision in an elliptical fashion around the fifth toe and lateral column of the foot.  Then incision was carried down to bone and the fifth toe was disarticulated from the MTP joint.  There was some fluid within the area and it was noted that there was likely some abscesses within multiple bursal cavities including the flexor and extensor tendons.  The toe was removed and a portion of the toe was then separately removed to send for culture.  The remaining toe was sent for pathology.  Then separate deep incision was created to gain access to the multiple bursal spaces.  Then hemostat was placed within the forefoot cavities and the fluid collections were deloculated.  The fluid was drained.  It was milky white fluid.  We then proceeded to perform a fourth MTP joint arthrotomy.  Separate deep incision was created more laterally.  We are able to gain access to the joint capsule of the fourth toe laterally.  An arthrotomy was created and it dorsal to plantar way to gain access to the fourth MTP joint.  There was a rush of milky white fluid once the arthrotomy was performed.  The joint was inspected and there was no obvious loose bodies.  The joint was then irrigated with normal saline.  Then the bursal fluid collections and abscess cavities were also irrigated with normal saline.  The entire wounds were irrigated.  Then we proceeded to undermine the skin edges to allow for tension-free closure.  Then the wound was closed with a 2-0 nylon suture.  It closed up well and there was good skin apposition.  No tourniquet was used.  He was then awakened from anesthesia and taken recovery in stable condition.    POST OPERATIVE INSTRUCTIONS: Flatfoot weightbearing in a postoperative shoe Keep dressing in place Patient will follow-up with me in 2 weeks for wound check Continue antibiotics per primary team due to continued  cellulitis  TOURNIQUET TIME: No tourniquet  BLOOD LOSS: Minimal         DRAINS: none         SPECIMEN: none       COMPLICATIONS:  * No complications entered in OR log *         Disposition: PACU - hemodynamically stable.         Condition: stable

## 2023-12-22 ENCOUNTER — Encounter (HOSPITAL_COMMUNITY): Payer: Self-pay | Admitting: Orthopaedic Surgery

## 2023-12-22 DIAGNOSIS — L089 Local infection of the skin and subcutaneous tissue, unspecified: Secondary | ICD-10-CM | POA: Diagnosis not present

## 2023-12-22 LAB — BASIC METABOLIC PANEL
Anion gap: 9 (ref 5–15)
BUN: 38 mg/dL — ABNORMAL HIGH (ref 8–23)
CO2: 18 mmol/L — ABNORMAL LOW (ref 22–32)
Calcium: 8.2 mg/dL — ABNORMAL LOW (ref 8.9–10.3)
Chloride: 109 mmol/L (ref 98–111)
Creatinine, Ser: 3.12 mg/dL — ABNORMAL HIGH (ref 0.61–1.24)
GFR, Estimated: 20 mL/min — ABNORMAL LOW (ref >60)
Glucose, Bld: 155 mg/dL — ABNORMAL HIGH (ref 70–99)
Potassium: 4.1 mmol/L (ref 3.5–5.1)
Sodium: 136 mmol/L (ref 135–145)

## 2023-12-22 LAB — GLUCOSE, CAPILLARY
Glucose-Capillary: 231 mg/dL — ABNORMAL HIGH (ref 70–99)
Glucose-Capillary: 149 mg/dL — ABNORMAL HIGH (ref 70–99)
Glucose-Capillary: 162 mg/dL — ABNORMAL HIGH (ref 70–99)
Glucose-Capillary: 173 mg/dL — ABNORMAL HIGH (ref 70–99)

## 2023-12-22 LAB — MAGNESIUM: Magnesium: 1.6 mg/dL — ABNORMAL LOW (ref 1.7–2.4)

## 2023-12-22 MED ORDER — LINEZOLID 600 MG PO TABS
600.0000 mg | ORAL_TABLET | Freq: Two times a day (BID) | ORAL | Status: DC
  Administered 2023-12-22 – 2023-12-24 (×5): 600 mg via ORAL
  Filled 2023-12-22 (×5): qty 1

## 2023-12-22 MED ORDER — METRONIDAZOLE 500 MG PO TABS
500.0000 mg | ORAL_TABLET | Freq: Two times a day (BID) | ORAL | Status: DC
  Administered 2023-12-22 – 2023-12-24 (×5): 500 mg via ORAL
  Filled 2023-12-22 (×5): qty 1

## 2023-12-22 NOTE — Progress Notes (Signed)
Orthopedic Tech Progress Note Patient Details:  Kyle Delgado 1946/10/02 [id number]  Ortho Devices Type of Ortho Device: Postop shoe/boot Ortho Device/Splint Location: LLE Ortho Device/Splint Interventions: Ordered, Application, Adjustment   Post Interventions Patient Tolerated: Well Instructions Provided: Care of device  [name] 12/22/2023, 9:15 AM

## 2023-12-22 NOTE — Plan of Care (Signed)

## 2023-12-22 NOTE — Evaluation (Signed)
Physical Therapy Evaluation Patient Details Name: Kyle Delgado MRN: [id number] DOB: 14-May-1946 Today's Date: 12/22/2023  History of Present Illness  Admitted with Left fifth toe osteomyelitis with likely forefoot abscess and fourth MTP septic arthrosis, s/p L 5th toe disarticulation, ok to bear weight in post-op shoe;  with medical history significant for uncontrolled type 2 diabetes, hypertension, hyperlipidemia, CKD 3B, obesity, coronary artery disease status post PCI with stent placement  Clinical Impression   Pt admitted with above diagnosis. Lives at home alone, in a single-level home with a ramped entrance; Prior to admission, pt was able to manage independently, driving, typically walking without a device -- uses rollator for longer distances; Presents to PT with L foot pain and resultant gait inefficiencies;  Overall Supervision level fo rbed mobility, and short distance amb; furniture walked today -- will consider using a rollaotr next session, if pt remains inpt tomight; Pt currently with functional limitations due to the deficits listed below (see PT Problem List). Pt will benefit from skilled PT to increase their independence and safety with mobility to allow discharge to the venue listed below.           If plan is discharge home, recommend the following: Assistance with cooking/housework   Can travel by private vehicle        Equipment Recommendations None recommended by PT (His Rollator shoud be adequate)  Recommendations for Other Services       Functional Status Assessment Patient has had a recent decline in their functional status and demonstrates the ability to make significant improvements in function in a reasonable and predictable amount of time.     Precautions / Restrictions Restrictions LLE Weight Bearing Per Provider Order: Weight bearing as tolerated Other Position/Activity Restrictions: Flatfoot Weightbearing in post operative shoe per OP Note       Mobility  Bed Mobility Overal bed mobility: Modified Independent             General bed mobility comments: Uses momentum to get up    Transfers Overall transfer level: Needs assistance Equipment used: None Transfers: Sit to/from Stand Sit to Stand: Supervision           General transfer comment: Tending to reach out for UE support for balance at initial stand    Ambulation/Gait Ambulation/Gait assistance: Supervision Gait Distance (Feet): 35 Feet Assistive device: None (but tending to furniture walk) Gait Pattern/deviations: Step-through pattern, Wide base of support       General Gait Details: Overall managing short distance amb well  Stairs            Wheelchair Mobility     Tilt Bed    Modified Rankin (Stroke Patients Only)       Balance Overall balance assessment: Mild deficits observed, not formally tested                                           Pertinent Vitals/Pain Pain Assessment Pain Assessment: 0-10 Pain Score: 6  Pain Location: L lateral foot with weight bearing Pain Descriptors / Indicators: Aching Pain Intervention(s): Monitored during session, Premedicated before session    Home Living Family/patient expects to be discharged to:: Private residence Living Arrangements: Alone Available Help at Discharge: Friend(s);Neighbor;Available PRN/intermittently Type of Home: House Home Access: Ramped entrance       Home Layout: One level Home Equipment: Rollator (4 wheels)  Prior Function Prior Level of Function : Independent/Modified Independent             Mobility Comments: Typically walks without an assistive device; uses a rollaotr prn; household ambulator, limited community ambulator ADLs Comments: Independent     Extremity/Trunk Assessment   Upper Extremity Assessment Upper Extremity Assessment: Overall WFL for tasks assessed    Lower Extremity Assessment Lower Extremity Assessment:  LLE deficits/detail LLE Deficits / Details: Foot dressed and wrapped; Hip, knee, ankle overall WFL for basic mobility tasks       Communication   Communication Communication: Hearing impairment  Cognition Arousal: Alert Behavior During Therapy: WFL for tasks assessed/performed Overall Cognitive Status: Within Functional Limits for tasks assessed                                          General Comments General comments (skin integrity, edema, etc.): Discussed ADL tips for managing at home; and to use rollator if in a lot of pain or if difficulty with amb; also encouraged pt to keep LLE elevated    Exercises     Assessment/Plan    PT Assessment Patient needs continued PT services  PT Problem List Decreased activity tolerance;Decreased mobility;Decreased coordination;Decreased knowledge of use of DME;Decreased knowledge of precautions;Pain       PT Treatment Interventions DME instruction;Gait training;Stair training;Functional mobility training;Therapeutic activities;Therapeutic exercise;Balance training;Patient/family education    PT Goals (Current goals can be found in the Care Plan section)  Acute Rehab PT Goals Patient Stated Goal: HOme soon PT Goal Formulation: With patient Time For Goal Achievement: 12/29/23 Potential to Achieve Goals: Good    Frequency Min 1X/week     Co-evaluation               AM-PAC PT "6 Clicks" Mobility  Outcome Measure Help needed turning from your back to your side while in a flat bed without using bedrails?: None Help needed moving from lying on your back to sitting on the side of a flat bed without using bedrails?: None Help needed moving to and from a bed to a chair (including a wheelchair)?: None Help needed standing up from a chair using your arms (e.g., wheelchair or bedside chair)?: None Help needed to walk in hospital room?: A Little Help needed climbing 3-5 steps with a railing? : A Little 6 Click Score:  22    End of Session   Activity Tolerance: Patient tolerated treatment well Patient left: in chair;with call bell/phone within reach Nurse Communication: Mobility status PT Visit Diagnosis: Other abnormalities of gait and mobility (R26.89);Pain Pain - Right/Left: Left Pain - part of body:  (Lateral foot)    Time: 4098-1191 PT Time Calculation (min) (ACUTE ONLY): 24 min   Charges:   PT Evaluation $PT Eval Low Complexity: 1 Low PT Treatments $Gait Training: 8-22 mins PT General Charges $$ ACUTE PT VISIT: 1 Visit         Van Clines, PT  Acute Rehabilitation Services Office [phone] Secure Chat welcomed   [name] 12/22/2023, 9:21 AM

## 2023-12-22 NOTE — Progress Notes (Signed)
PROGRESS NOTE    Kyle Delgado  ZOX:[id number] DOB: 05-29-1946 DOA: 12/19/2023 PCP: Kirby Funk, MD (Inactive)   Brief Narrative:  78 y.o. male with medical history significant for uncontrolled type 2 diabetes, hypertension, hyperlipidemia, CKD 3B, obesity, coronary artery disease status post PCI with stent placement presented with left fifth toe infection with concern for osteomyelitis from [facility].  On presentation, left foot x-ray revealed soft tissue swelling with destructive appearance to the phalanges of the fifth digit particularly mid and distal.  He was started on IV antibiotics.  Orthopedics was consulted.  MRI of left foot showed left fifth toe osteomyelitis.  Assessment & Plan:   Acute left fifth toe osteomyelitis along with left forefoot abscess -Currently on broad-spectrum antibiotics.   -Status post left fifth MTP disarticulation along with I&D of left forefoot abscess and left fourth MTP arthrotomy by orthopedics on 12/21/2023.  Follow OR cultures.  Orthopedic recommending outpatient follow-up with orthopedics.  Wound care as per orthopedics. -Blood cultures negative so far.  AKI on CKD stage IIIb Acute metabolic acidosis -Possibly from dehydration and poor oral intake.  Baseline creatinine unknown as most recent creatinine in our system was from 4 years ago which was close to 1.3-1.5.  Presented with creatinine of 3.70.  Improving to 3.12 today.  Monitor.  Continue IV fluids for 1 more day.  Hyponatremia -Improved  Anemia of chronic disease -From renal failure.  Hemoglobin stable but monitor intermittently  Hypomagnesemia -Replace.  Repeat a.m. labs  Thrombocytosis -Possibly reactive.  Monitor intermittently  Diabetes mellitus type 2 with hyperglycemia -A1c 9.  Continue CBGs with SSI  Hypertension  hyperlipidemia -Continue amlodipine, metoprolol and statin.  Lisinopril, chlorthalidone on hold.  Diabetic nephropathy -Continue gabapentin  CAD with history  of PCI with stent placement -Currently stable.  Restart home antiplatelets once okay with orthopedics.  Continue statin  Obesity class II -Outpatient follow-up  DVT prophylaxis: SCDs  code Status: DNR Family Communication: None at bedside Disposition Plan: Status is: Inpatient Remains inpatient appropriate because: Of severity of illness    Consultants: Orthopedics Procedures: As above  Antimicrobials:  Anti-infectives (From admission, onward)    Start     Dose/Rate Route Frequency Ordered Stop   12/22/23 1000  metroNIDAZOLE (FLAGYL) tablet 500 mg        500 mg Oral Every 12 hours 12/22/23 0841     12/22/23 1000  linezolid (ZYVOX) tablet 600 mg        600 mg Oral Every 12 hours 12/22/23 0841     12/21/23 0600  ceFAZolin (ANCEF) IVPB 2g/100 mL premix        2 g 200 mL/hr over 30 Minutes Intravenous On call to O.R. 12/20/23 1742 12/21/23 0730   12/20/23 1900  linezolid (ZYVOX) IVPB 600 mg  Status:  Discontinued        600 mg 300 mL/hr over 60 Minutes Intravenous Every 12 hours 12/19/23 2043 12/22/23 0841   12/20/23 0915  metroNIDAZOLE (FLAGYL) IVPB 500 mg  Status:  Discontinued        500 mg 100 mL/hr over 60 Minutes Intravenous Every 12 hours 12/20/23 0828 12/22/23 0841   12/19/23 2045  cefTRIAXone (ROCEPHIN) 2 g in sodium chloride 0.9 % 100 mL IVPB        2 g 200 mL/hr over 30 Minutes Intravenous Every 24 hours 12/19/23 2043     12/19/23 1915  vancomycin (VANCOCIN) 2,500 mg in sodium chloride 0.9 % 500 mL IVPB  2,500 mg 262.5 mL/hr over 120 Minutes Intravenous  Once 12/19/23 1911 12/19/23 2301   12/19/23 1900  vancomycin (VANCOCIN) IVPB 1000 mg/200 mL premix  Status:  Discontinued        1,000 mg 200 mL/hr over 60 Minutes Intravenous  Once 12/19/23 1851 12/19/23 1911        Subjective: Patient seen and examined at bedside.  Complains of intermittent left foot pain.  No vomiting, chest pain, shortness of breath reported  objective: Vitals:   12/21/23 1606  12/21/23 2021 12/22/23 0558 12/22/23 0929  BP: (!) 190/85 (!) 168/82 (!) 183/76 (!) 166/82  Pulse: 81 83 73 78  Resp:  18 18   Temp: (!) 97.4 F (36.3 C) 98.2 F (36.8 C) 98.2 F (36.8 C) 97.6 F (36.4 C)  TempSrc: Oral  Oral Oral  SpO2: 98% 96% 98% 97%  Weight:      Height:        Intake/Output Summary (Last 24 hours) at 12/22/2023 1032 Last data filed at 12/22/2023 0600 Gross per 24 hour  Intake 4229.93 ml  Output 1075 ml  Net 3154.93 ml   Filed Weights   12/19/23 1202 12/20/23 0333 12/21/23 0643  Weight: 115.7 kg 112.6 kg 112.6 kg    Examination:  General: No acute distress.  On room air currently. ENT/neck: No JVD elevation or palpable neck masses noted  respiratory: Bilateral decreased breath sounds at bases with scattered crackles  CVS: Rate mostly controlled; S1 and S2 are heard Abdominal: Soft, nontender, distended mildly; no organomegaly, bowel sounds are heard normally Extremities: No clubbing; mild lower extremity edema present CNS: Alert and oriented.  No focal neurologic deficit.  Able to move extremities Lymph: No obvious palpable lymphadenopathy Skin: No rashes/petechiae  psych: Flat affect and currently not agitated. musculoskeletal: Left foot dressing present and is in a boot     Data Reviewed: I have personally reviewed following labs and imaging studies  CBC: Recent Labs  Lab 12/19/23 1215 12/20/23 0614 12/21/23 0605  WBC 9.3 6.1 5.7  NEUTROABS 7.4  --  3.5  HGB 11.0* 11.1* 11.4*  HCT 35.3* 35.4* 36.2*  MCV 81.9 80.8 81.0  PLT 507* 454* 465*   Basic Metabolic Panel: Recent Labs  Lab 12/19/23 1215 12/20/23 0614 12/21/23 0605 12/22/23 0403  NA 134* 137 139 136  K 5.1 4.4 4.4 4.1  CL 105 108 110 109  CO2 19* 20* 19* 18*  GLUCOSE 198* 243* 154* 155*  BUN 64* 55* 45* 38*  CREATININE 3.72* 3.27* 3.21* 3.12*  CALCIUM 8.7* 8.2* 8.4* 8.2*  MG  --  1.7 1.7 1.6*  PHOS  --  4.0  --   --    GFR: Estimated Creatinine Clearance: 24.5  mL/min (A) (by C-G formula based on SCr of 3.12 mg/dL (H)). Liver Function Tests: Recent Labs  Lab 12/19/23 1215 12/20/23 0614  AST 14*  --   ALT 16  --   ALKPHOS 70  --   BILITOT 0.7  --   PROT 7.2  --   ALBUMIN 3.1* 2.5*   No results for input(s): "LIPASE", "AMYLASE" in the last 168 hours. No results for input(s): "AMMONIA" in the last 168 hours. Coagulation Profile: No results for input(s): "INR", "PROTIME" in the last 168 hours. Cardiac Enzymes: No results for input(s): "CKTOTAL", "CKMB", "CKMBINDEX", "TROPONINI" in the last 168 hours. BNP (last 3 results) No results for input(s): "PROBNP" in the last 8760 hours. HbA1C: Recent Labs    12/20/23 [phone]  HGBA1C 9.0*   CBG: Recent Labs  Lab 12/21/23 0840 12/21/23 1117 12/21/23 1638 12/21/23 2019 12/22/23 0833  GLUCAP 173* 151* 236* 260* 231*   Lipid Profile: No results for input(s): "CHOL", "HDL", "LDLCALC", "TRIG", "CHOLHDL", "LDLDIRECT" in the last 72 hours. Thyroid Function Tests: No results for input(s): "TSH", "T4TOTAL", "FREET4", "T3FREE", "THYROIDAB" in the last 72 hours. Anemia Panel: No results for input(s): "VITAMINB12", "FOLATE", "FERRITIN", "TIBC", "IRON", "RETICCTPCT" in the last 72 hours. Sepsis Labs: Recent Labs  Lab 12/19/23 1219  LATICACIDVEN 0.6    Recent Results (from the past 240 hours)  Culture, blood (Routine X 2) w Reflex to ID Panel     Status: None (Preliminary result)   Collection Time: 12/19/23  6:51 PM   Specimen: BLOOD  Result Value Ref Range Status   Specimen Description BLOOD SITE NOT SPECIFIED  Final   Special Requests   Final    BOTTLES DRAWN AEROBIC AND ANAEROBIC Blood Culture results may not be optimal due to an inadequate volume of blood received in culture bottles   Culture   Final    NO GROWTH 3 DAYS Performed at [facility], [address] [place], [place] [address]    Report Status PENDING  Incomplete  Culture, blood (Routine X 2) w Reflex to ID Panel      Status: None (Preliminary result)   Collection Time: 12/19/23  7:05 PM   Specimen: BLOOD  Result Value Ref Range Status   Specimen Description BLOOD SITE NOT SPECIFIED  Final   Special Requests   Final    BOTTLES DRAWN AEROBIC AND ANAEROBIC Blood Culture results may not be optimal due to an inadequate volume of blood received in culture bottles   Culture   Final    NO GROWTH 3 DAYS Performed at [facility], [address], [place], [place] [address]    Report Status PENDING  Incomplete  Surgical pcr screen     Status: None   Collection Time: 12/20/23  6:00 PM   Specimen: Nasal Mucosa; Nasal Swab  Result Value Ref Range Status   MRSA, PCR NEGATIVE NEGATIVE Final   Staphylococcus aureus NEGATIVE NEGATIVE Final    Comment: (NOTE) The Xpert SA Assay (FDA approved for NASAL specimens in patients 61 years of age and older), is one component of a comprehensive surveillance program. It is not intended to diagnose infection nor to guide or monitor treatment. Performed at [facility], [address] [place], [place] [address]   Aerobic/Anaerobic Culture w Gram Stain (surgical/deep wound)     Status: None (Preliminary result)   Collection Time: 12/21/23  8:00 AM   Specimen: PATH Soft tissue resection  Result Value Ref Range Status   Specimen Description TISSUE  Final   Special Requests left 5th toe  Final   Gram Stain NO WBC SEEN NO ORGANISMS SEEN   Final   Culture   Final    NO GROWTH < 24 HOURS Performed at [facility], [address], [place], [place] [address]    Report Status PENDING  Incomplete         Radiology Studies: No results found.       Scheduled Meds:  amLODipine  10 mg Oral Daily   atorvastatin  40 mg Oral QHS   gabapentin  200 mg Oral QHS   insulin aspart  0-5 Units Subcutaneous QHS   insulin aspart  0-9 Units Subcutaneous TID WC   linezolid  600  mg Oral Q12H   metoprolol succinate  12.5 mg Oral Daily   metroNIDAZOLE  500 mg  Oral Q12H   Continuous Infusions:  sodium chloride 100 mL/hr at 12/22/23 0049   cefTRIAXone (ROCEPHIN)  IV 2 g (12/21/23 2027)          Glade Lloyd, MD [facility] 12/22/2023, 10:32 AM

## 2023-12-23 DIAGNOSIS — L089 Local infection of the skin and subcutaneous tissue, unspecified: Secondary | ICD-10-CM | POA: Diagnosis not present

## 2023-12-23 LAB — CBC WITH DIFFERENTIAL/PLATELET
Abs Immature Granulocytes: 0.01 10*3/uL (ref 0.00–0.07)
Basophils Absolute: 0.1 10*3/uL (ref 0.0–0.1)
Basophils Relative: 1 %
Eosinophils Absolute: 0.4 10*3/uL (ref 0.0–0.5)
Eosinophils Relative: 5 %
HCT: 36.5 % — ABNORMAL LOW (ref 39.0–52.0)
Hemoglobin: 11.6 g/dL — ABNORMAL LOW (ref 13.0–17.0)
Immature Granulocytes: 0 %
Lymphocytes Relative: 19 %
Lymphs Abs: 1.4 10*3/uL (ref 0.7–4.0)
MCH: 25.8 pg — ABNORMAL LOW (ref 26.0–34.0)
MCHC: 31.8 g/dL (ref 30.0–36.0)
MCV: 81.3 fL (ref 80.0–100.0)
Monocytes Absolute: 0.6 10*3/uL (ref 0.1–1.0)
Monocytes Relative: 8 %
Neutro Abs: 5 10*3/uL (ref 1.7–7.7)
Neutrophils Relative %: 67 %
Platelets: 436 10*3/uL — ABNORMAL HIGH (ref 150–400)
RBC: 4.49 MIL/uL (ref 4.22–5.81)
RDW: 13.4 % (ref 11.5–15.5)
WBC: 7.3 10*3/uL (ref 4.0–10.5)
nRBC: 0 % (ref 0.0–0.2)

## 2023-12-23 LAB — BASIC METABOLIC PANEL
Anion gap: 8 (ref 5–15)
BUN: 29 mg/dL — ABNORMAL HIGH (ref 8–23)
CO2: 18 mmol/L — ABNORMAL LOW (ref 22–32)
Calcium: 8.5 mg/dL — ABNORMAL LOW (ref 8.9–10.3)
Chloride: 112 mmol/L — ABNORMAL HIGH (ref 98–111)
Creatinine, Ser: 2.96 mg/dL — ABNORMAL HIGH (ref 0.61–1.24)
GFR, Estimated: 21 mL/min — ABNORMAL LOW (ref >60)
Glucose, Bld: 150 mg/dL — ABNORMAL HIGH (ref 70–99)
Potassium: 4.3 mmol/L (ref 3.5–5.1)
Sodium: 138 mmol/L (ref 135–145)

## 2023-12-23 LAB — GLUCOSE, CAPILLARY
Glucose-Capillary: 137 mg/dL — ABNORMAL HIGH (ref 70–99)
Glucose-Capillary: 192 mg/dL — ABNORMAL HIGH (ref 70–99)
Glucose-Capillary: 164 mg/dL — ABNORMAL HIGH (ref 70–99)
Glucose-Capillary: 191 mg/dL — ABNORMAL HIGH (ref 70–99)

## 2023-12-23 LAB — SURGICAL PATHOLOGY

## 2023-12-23 LAB — MAGNESIUM: Magnesium: 1.5 mg/dL — ABNORMAL LOW (ref 1.7–2.4)

## 2023-12-23 LAB — C-REACTIVE PROTEIN: CRP: 1.5 mg/dL — ABNORMAL HIGH (ref <1.0)

## 2023-12-23 MED ORDER — SODIUM BICARBONATE 650 MG PO TABS
650.0000 mg | ORAL_TABLET | Freq: Three times a day (TID) | ORAL | Status: DC
  Administered 2023-12-23 – 2023-12-24 (×4): 650 mg via ORAL
  Filled 2023-12-23 (×4): qty 1

## 2023-12-23 MED ORDER — ASPIRIN 81 MG PO CHEW
81.0000 mg | CHEWABLE_TABLET | Freq: Every day | ORAL | Status: DC
  Administered 2023-12-23 – 2023-12-24 (×2): 81 mg via ORAL
  Filled 2023-12-23 (×2): qty 1

## 2023-12-23 MED ORDER — CLOPIDOGREL BISULFATE 75 MG PO TABS
75.0000 mg | ORAL_TABLET | Freq: Every day | ORAL | Status: DC
  Administered 2023-12-23 – 2023-12-24 (×2): 75 mg via ORAL
  Filled 2023-12-23 (×2): qty 1

## 2023-12-23 MED ORDER — MAGNESIUM SULFATE 2 GM/50ML IV SOLN
2.0000 g | Freq: Once | INTRAVENOUS | Status: AC
  Administered 2023-12-23: 2 g via INTRAVENOUS
  Filled 2023-12-23: qty 50

## 2023-12-23 NOTE — Consult Note (Signed)
[facility] Liaison Consult Note   12/23/2023  Kyle Delgado 17-Apr-1946 [id number]  *Referral:  Inpatient TOC RN per family   Insurance: [employer] Medicare  Primary Care Provider: Hillard Danker, MD with [facility] at [facility] per patient,  this provider is listed for the transition of care follow up appointments  and [facility] TOC follow up calls   [facility] Liaison met patient at bedside at [facility]. Spoke with patient regarding his post hospital and home needs.  Patient states he feels like he sees the [employer] at the [place] location, a lot. He get his medication from them via mail order.    The patient was screened for referral needs for post hospitalization with 1 hospital admissions in 6 months.  The patient was assessed for potential Community Care Coordination service needs for post hospital transition for care coordination. Review of patient's electronic medical record reveals patient is followed by the [facility] Transition Team.   Plan: [facility] Liaison will continue to follow progress and disposition to asess for post hospital community care coordination needs.  Referral request for community care coordination: will update the [facility] Transition team and make aware of post hospital follow up request for closer medication management needs.   [facility] Community Care, Population Health does not replace or interfere with any arrangements made by the Inpatient Transition of Care team.   For questions contact:   Charlesetta Shanks, RN, BSN, CCM [place]  [facility] Liaison Direct Dial: [phone] or secure chat Email: [email or web address]

## 2023-12-23 NOTE — Plan of Care (Signed)

## 2023-12-23 NOTE — Progress Notes (Addendum)
PROGRESS NOTE    Kyle Delgado  ZOX:[id number] DOB: [birth year] DOA: 12/19/2023 PCP: Kirby Funk, MD (Inactive)   Brief Narrative:  78 y.o. male with medical history significant for uncontrolled type 2 diabetes, hypertension, hyperlipidemia, CKD 3B, obesity, coronary artery disease status post PCI with stent placement presented with left fifth toe infection with concern for osteomyelitis from [facility].  On presentation, left foot x-ray revealed soft tissue swelling with destructive appearance to the phalanges of the fifth digit particularly mid and distal.  He was started on IV antibiotics.  Orthopedics was consulted.  MRI of left foot showed left fifth toe osteomyelitis.  He underwent surgical intervention by orthopedics on 12/21/2023.  Assessment & Plan:   Acute left fifth toe osteomyelitis along with left forefoot abscess -Currently on broad-spectrum antibiotics.   -Status post left fifth MTP disarticulation along with I&D of left forefoot abscess and left fourth MTP arthrotomy by orthopedics on 12/21/2023.  Follow OR cultures: Negative so far.  Orthopedic recommending outpatient follow-up with orthopedics.  Communicated with Dr. Susa Simmonds via secure chat on 12/22/2023: He recommended that patient be switched to 2 weeks of oral antibiotics on discharge till he sees the patient as an outpatient.  Wound care as per orthopedics. -Blood cultures negative so far.  AKI on CKD stage IIIb Acute metabolic acidosis -Possibly from dehydration and poor oral intake.  Baseline creatinine unknown as most recent creatinine in our system was from 4 years ago which was close to 1.3-1.5.  No recent creatinine available.  Presented with creatinine of 3.70.  Improving to 2.96 today.  Repeat a.m. labs.  Continue IV fluids for 1 more day. -Start oral sodium bicarbonate.  Hyponatremia -Improved  Anemia of chronic disease -From renal failure.  Hemoglobin stable but monitor intermittently  Hypomagnesemia -Replace.   Repeat a.m. labs  Thrombocytosis -Possibly reactive.  Monitor intermittently  Diabetes mellitus type 2 with hyperglycemia -A1c 9.  Continue CBGs with SSI  Hypertension  hyperlipidemia -Continue amlodipine, metoprolol and statin.  Blood pressure intermittently elevated.  Lisinopril, chlorthalidone on hold.  Diabetic nephropathy -Continue gabapentin  CAD with history of PCI with stent placement -Currently stable.  Restart home aspirin and Plavix.  Continue statin  Obesity class II -Outpatient follow-up  DVT prophylaxis: SCDs  code Status: DNR Family Communication: None at bedside Disposition Plan: Status is: Inpatient Remains inpatient appropriate because: Of severity of illness.  Possible discharge in 1 to 2 days if creatinine continues to improve.    Consultants: Orthopedics Procedures: As above  Antimicrobials:  Anti-infectives (From admission, onward)    Start     Dose/Rate Route Frequency Ordered Stop   12/22/23 1000  metroNIDAZOLE (FLAGYL) tablet 500 mg        500 mg Oral Every 12 hours 12/22/23 0841     12/22/23 1000  linezolid (ZYVOX) tablet 600 mg        600 mg Oral Every 12 hours 12/22/23 0841     12/21/23 0600  ceFAZolin (ANCEF) IVPB 2g/100 mL premix        2 g 200 mL/hr over 30 Minutes Intravenous On call to O.R. 12/20/23 1742 12/21/23 0730   12/20/23 1900  linezolid (ZYVOX) IVPB 600 mg  Status:  Discontinued        600 mg 300 mL/hr over 60 Minutes Intravenous Every 12 hours 12/19/23 2043 12/22/23 0841   12/20/23 0915  metroNIDAZOLE (FLAGYL) IVPB 500 mg  Status:  Discontinued        500 mg 100 mL/hr over 60  Minutes Intravenous Every 12 hours 12/20/23 0828 12/22/23 0841   12/19/23 2045  cefTRIAXone (ROCEPHIN) 2 g in sodium chloride 0.9 % 100 mL IVPB        2 g 200 mL/hr over 30 Minutes Intravenous Every 24 hours 12/19/23 2043     12/19/23 1915  vancomycin (VANCOCIN) 2,500 mg in sodium chloride 0.9 % 500 mL IVPB        2,500 mg 262.5 mL/hr over 120  Minutes Intravenous  Once 12/19/23 1911 12/19/23 2301   12/19/23 1900  vancomycin (VANCOCIN) IVPB 1000 mg/200 mL premix  Status:  Discontinued        1,000 mg 200 mL/hr over 60 Minutes Intravenous  Once 12/19/23 1851 12/19/23 1911        Subjective: Patient seen and examined at bedside.  Denies worsening shortness of breath, fever, vomiting  objective: Vitals:   12/22/23 2014 12/23/23 0442 12/23/23 0856 12/23/23 0900  BP: (!) 169/83 (!) 165/78 (!) 180/87 (!) 180/87  Pulse: 77 76 83 80  Resp: 19 18  20   Temp: 98.2 F (36.8 C) 98 F (36.7 C)  (!) 97.5 F (36.4 C)  TempSrc: Oral Oral  Oral  SpO2: 100% 97%  98%  Weight:      Height:        Intake/Output Summary (Last 24 hours) at 12/23/2023 1046 Last data filed at 12/23/2023 0600 Gross per 24 hour  Intake 2758.51 ml  Output 950 ml  Net 1808.51 ml   Filed Weights   12/19/23 1202 12/20/23 0333 12/21/23 0643  Weight: 115.7 kg 112.6 kg 112.6 kg    Examination:  General: On room air.  No distress ENT/neck: No obvious thyromegaly or JVD elevation noted respiratory: Decreased breath sounds at bases bilaterally with some crackles CVS: S1 and S2 are heard; rate currently controlled Abdominal: Soft, nontender, mildly distended; no organomegaly, normal bowel sounds heard  extremities: Trace lower extremity edema present; no cyanosis CNS: Awake and oriented.  No obvious focal deficits noted  lymph: No lymphadenopathy palpable Skin: No ecchymosis/lesions  psych: Showing no signs of agitation.  Affect is flat. musculoskeletal: Left foot dressing present and is in a boot     Data Reviewed: I have personally reviewed following labs and imaging studies  CBC: Recent Labs  Lab 12/19/23 1215 12/20/23 0614 12/21/23 0605 12/23/23 0830  WBC 9.3 6.1 5.7 7.3  NEUTROABS 7.4  --  3.5 5.0  HGB 11.0* 11.1* 11.4* 11.6*  HCT 35.3* 35.4* 36.2* 36.5*  MCV 81.9 80.8 81.0 81.3  PLT 507* 454* 465* 436*   Basic Metabolic Panel: Recent  Labs  Lab 12/19/23 1215 12/20/23 0614 12/21/23 0605 12/22/23 0403 12/23/23 0830  NA 134* 137 139 136 138  K 5.1 4.4 4.4 4.1 4.3  CL 105 108 110 109 112*  CO2 19* 20* 19* 18* 18*  GLUCOSE 198* 243* 154* 155* 150*  BUN 64* 55* 45* 38* 29*  CREATININE 3.72* 3.27* 3.21* 3.12* 2.96*  CALCIUM 8.7* 8.2* 8.4* 8.2* 8.5*  MG  --  1.7 1.7 1.6* 1.5*  PHOS  --  4.0  --   --   --    GFR: Estimated Creatinine Clearance: 25.9 mL/min (A) (by C-G formula based on SCr of 2.96 mg/dL (H)). Liver Function Tests: Recent Labs  Lab 12/19/23 1215 12/20/23 0614  AST 14*  --   ALT 16  --   ALKPHOS 70  --   BILITOT 0.7  --   PROT 7.2  --  ALBUMIN 3.1* 2.5*   No results for input(s): "LIPASE", "AMYLASE" in the last 168 hours. No results for input(s): "AMMONIA" in the last 168 hours. Coagulation Profile: No results for input(s): "INR", "PROTIME" in the last 168 hours. Cardiac Enzymes: No results for input(s): "CKTOTAL", "CKMB", "CKMBINDEX", "TROPONINI" in the last 168 hours. BNP (last 3 results) No results for input(s): "PROBNP" in the last 8760 hours. HbA1C: No results for input(s): "HGBA1C" in the last 72 hours.  CBG: Recent Labs  Lab 12/22/23 0833 12/22/23 1154 12/22/23 1658 12/22/23 2004 12/23/23 0729  GLUCAP 231* 149* 162* 173* 137*   Lipid Profile: No results for input(s): "CHOL", "HDL", "LDLCALC", "TRIG", "CHOLHDL", "LDLDIRECT" in the last 72 hours. Thyroid Function Tests: No results for input(s): "TSH", "T4TOTAL", "FREET4", "T3FREE", "THYROIDAB" in the last 72 hours. Anemia Panel: No results for input(s): "VITAMINB12", "FOLATE", "FERRITIN", "TIBC", "IRON", "RETICCTPCT" in the last 72 hours. Sepsis Labs: Recent Labs  Lab 12/19/23 1219  LATICACIDVEN 0.6    Recent Results (from the past 240 hours)  Culture, blood (Routine X 2) w Reflex to ID Panel     Status: None (Preliminary result)   Collection Time: 12/19/23  6:51 PM   Specimen: BLOOD  Result Value Ref Range Status    Specimen Description BLOOD SITE NOT SPECIFIED  Final   Special Requests   Final    BOTTLES DRAWN AEROBIC AND ANAEROBIC Blood Culture results may not be optimal due to an inadequate volume of blood received in culture bottles   Culture   Final    NO GROWTH 4 DAYS Performed at [facility], [address] [place], [place] [address]    Report Status PENDING  Incomplete  Culture, blood (Routine X 2) w Reflex to ID Panel     Status: None (Preliminary result)   Collection Time: 12/19/23  7:05 PM   Specimen: BLOOD  Result Value Ref Range Status   Specimen Description BLOOD SITE NOT SPECIFIED  Final   Special Requests   Final    BOTTLES DRAWN AEROBIC AND ANAEROBIC Blood Culture results may not be optimal due to an inadequate volume of blood received in culture bottles   Culture   Final    NO GROWTH 4 DAYS Performed at [facility], [address], [place], [place] [address]    Report Status PENDING  Incomplete  Surgical pcr screen     Status: None   Collection Time: 12/20/23  6:00 PM   Specimen: Nasal Mucosa; Nasal Swab  Result Value Ref Range Status   MRSA, PCR NEGATIVE NEGATIVE Final   Staphylococcus aureus NEGATIVE NEGATIVE Final    Comment: (NOTE) The Xpert SA Assay (FDA approved for NASAL specimens in patients 67 years of age and older), is one component of a comprehensive surveillance program. It is not intended to diagnose infection nor to guide or monitor treatment. Performed at [facility], [address], [place], [place] [address]   Aerobic/Anaerobic Culture w Gram Stain (surgical/deep wound)     Status: None (Preliminary result)   Collection Time: 12/21/23  8:00 AM   Specimen: PATH Soft tissue resection  Result Value Ref Range Status   Specimen Description TISSUE  Final   Special Requests left 5th toe  Final   Gram Stain NO WBC SEEN NO ORGANISMS SEEN   Final   Culture   Final    NO GROWTH 2 DAYS Performed at [facility], [address] [place], [place] [address]  Report Status PENDING  Incomplete         Radiology Studies: No results found.       Scheduled Meds:  amLODipine  10 mg Oral Daily   aspirin  81 mg Oral Daily   atorvastatin  40 mg Oral QHS   clopidogrel  75 mg Oral Daily   gabapentin  200 mg Oral QHS   insulin aspart  0-5 Units Subcutaneous QHS   insulin aspart  0-9 Units Subcutaneous TID WC   linezolid  600 mg Oral Q12H   metoprolol succinate  12.5 mg Oral Daily   metroNIDAZOLE  500 mg Oral Q12H   Continuous Infusions:  sodium chloride 100 mL/hr at 12/23/23 0756   cefTRIAXone (ROCEPHIN)  IV 2 g (12/22/23 2056)   magnesium sulfate bolus IVPB            Glade Lloyd, MD [facility] 12/23/2023, 10:46 AM

## 2023-12-23 NOTE — TOC CM/SW Note (Signed)
Transition of Care [facility]) - Inpatient Brief Assessment   Patient Details  Name: Kyle Delgado MRN: [id number] Date of Birth: 23-Aug-1946  Transition of Care [facility]) CM/SW Contact:    Tom-Johnson, Hershal Coria, RN Phone Number: 12/23/2023, 4:07 PM   Clinical Narrative:  Patient presented to the ED from the [facility] in [place] with concern for Lt Fifth Toe Osteomyelitis, Lt Forefoot Abscess, Multiple Bursal Spaces, Lt Fourth MTP Joint Septic Arthrosis. Ortho consulted and patient underwent   Left Fifth Toe MTP Disarticulation, I&D of Lt Forefoot Abscess, Lt Fourth MTP Arthrotomy on 12/21/23. Patient on Flatfoot weight bearing restrictions in Post op shoe.  Continues on IV abx.  From home alone, does not have children. Independent with care and drive self prior to admission. Siblings supportive.  Has a cane, rollator and shower seat at home.  PCP is Kirby Funk, MD and uses the [facility].  Outpatient PT recommended, patient states he will arrange when he returns to the [facility] for f/u.   Patient not Medically ready for discharge.  CM will continue to follow as patient progresses with care towards discharge.      Transition of Care Asessment: Insurance and Status: Insurance coverage has been reviewed Patient has primary care physician: Yes Home environment has been reviewed: Yes Prior level of function:: Modified Independent Prior/Current Home Services: No current home services Social Drivers of Health Review: SDOH reviewed no interventions necessary Readmission risk has been reviewed: Yes Transition of care needs: transition of care needs identified, TOC will continue to follow

## 2023-12-23 NOTE — Progress Notes (Signed)
Mobility Specialist Progress Note:    12/23/23 1500  Mobility  Activity Ambulated with assistance in hallway  Level of Assistance Contact guard assist, steadying assist  Assistive Device Front wheel walker  Distance Ambulated (ft) 100 ft  LLE Weight Bearing Per Provider Order WBAT (post-op shoe)  Activity Response Tolerated well  Mobility Referral Yes  Mobility visit 1 Mobility  Mobility Specialist Start Time (ACUTE ONLY) 1434  Mobility Specialist Stop Time (ACUTE ONLY) 1442  Mobility Specialist Time Calculation (min) (ACUTE ONLY) 8 min   Pt received in bed and agreeable. C/o RW veering to L side. Had x1 LOB, corrected w/ minA. Returned to room w/o fault. Pt left on EOB with call bell and all needs met.  D'Vante Earlene Plater Mobility Specialist Please contact via Special educational needs teacher or Rehab office at [phone]

## 2023-12-24 DIAGNOSIS — L089 Local infection of the skin and subcutaneous tissue, unspecified: Secondary | ICD-10-CM | POA: Diagnosis not present

## 2023-12-24 LAB — MAGNESIUM: Magnesium: 1.8 mg/dL (ref 1.7–2.4)

## 2023-12-24 LAB — BASIC METABOLIC PANEL
Anion gap: 6 (ref 5–15)
BUN: 27 mg/dL — ABNORMAL HIGH (ref 8–23)
CO2: 19 mmol/L — ABNORMAL LOW (ref 22–32)
Calcium: 7.9 mg/dL — ABNORMAL LOW (ref 8.9–10.3)
Chloride: 112 mmol/L — ABNORMAL HIGH (ref 98–111)
Creatinine, Ser: 2.82 mg/dL — ABNORMAL HIGH (ref 0.61–1.24)
GFR, Estimated: 22 mL/min — ABNORMAL LOW (ref >60)
Glucose, Bld: 163 mg/dL — ABNORMAL HIGH (ref 70–99)
Potassium: 4.3 mmol/L (ref 3.5–5.1)
Sodium: 137 mmol/L (ref 135–145)

## 2023-12-24 LAB — CULTURE, BLOOD (ROUTINE X 2)
Culture: NO GROWTH
Culture: NO GROWTH

## 2023-12-24 LAB — GLUCOSE, CAPILLARY: Glucose-Capillary: 177 mg/dL — ABNORMAL HIGH (ref 70–99)

## 2023-12-24 MED ORDER — METOPROLOL SUCCINATE ER 25 MG PO TB24: 12.5000 mg | ORAL_TABLET | Freq: Every day | ORAL | 2 refills | Status: AC

## 2023-12-24 MED ORDER — SODIUM BICARBONATE 650 MG PO TABS: 650.0000 mg | ORAL_TABLET | Freq: Three times a day (TID) | ORAL | 1 refills | Status: AC

## 2023-12-24 MED ORDER — METRONIDAZOLE 500 MG PO TABS: 500.0000 mg | ORAL_TABLET | Freq: Two times a day (BID) | ORAL | 0 refills | Status: AC

## 2023-12-24 MED ORDER — LINEZOLID 600 MG PO TABS: 600.0000 mg | ORAL_TABLET | Freq: Two times a day (BID) | ORAL | 0 refills | Status: AC

## 2023-12-24 MED ORDER — OXYCODONE HCL 5 MG PO TABS: 5.0000 mg | ORAL_TABLET | Freq: Four times a day (QID) | ORAL | 0 refills | Status: AC | PRN

## 2023-12-24 NOTE — Progress Notes (Signed)
DISCHARGE NOTE HOME Kyle Delgado to be discharged Home per MD order. Discussed prescriptions and follow up appointments with the patient. Prescriptions given to patient; medication list explained in detail. Patient verbalized understanding.  Skin clean, dry and intact without evidence of skin break down, no evidence of skin tears noted. IV catheter discontinued intact. Site without signs and symptoms of complications. Dressing and pressure applied. Pt denies pain at the site currently. No complaints noted.  Patient free of lines, drains, and wounds.   An After Visit Summary (AVS) was printed and given to the patient. Patient escorted via wheelchair to discharge lounge.  Margarita Grizzle, RN

## 2023-12-24 NOTE — Plan of Care (Signed)
  Problem: Coping: Goal: Ability to adjust to condition or change in health will improve Outcome: Adequate for Discharge   Problem: Fluid Volume: Goal: Ability to maintain a balanced intake and output will improve Outcome: Adequate for Discharge   Problem: Health Behavior/Discharge Planning: Goal: Ability to identify and utilize available resources and services will improve Outcome: Adequate for Discharge Goal: Ability to manage health-related needs will improve Outcome: Adequate for Discharge   Problem: Metabolic: Goal: Ability to maintain appropriate glucose levels will improve Outcome: Adequate for Discharge   Problem: Nutritional: Goal: Maintenance of adequate nutrition will improve Outcome: Adequate for Discharge Goal: Progress toward achieving an optimal weight will improve Outcome: Adequate for Discharge   Problem: Skin Integrity: Goal: Risk for impaired skin integrity will decrease Outcome: Adequate for Discharge   Problem: Tissue Perfusion: Goal: Adequacy of tissue perfusion will improve Outcome: Adequate for Discharge   Problem: Education: Goal: Knowledge of General Education information will improve Description: Including pain rating scale, medication(s)/side effects and non-pharmacologic comfort measures Outcome: Adequate for Discharge   Problem: Health Behavior/Discharge Planning: Goal: Ability to manage health-related needs will improve Outcome: Adequate for Discharge   Problem: Clinical Measurements: Goal: Ability to maintain clinical measurements within normal limits will improve Outcome: Adequate for Discharge Goal: Will remain free from infection Outcome: Adequate for Discharge Goal: Diagnostic test results will improve Outcome: Adequate for Discharge Goal: Respiratory complications will improve Outcome: Adequate for Discharge Goal: Cardiovascular complication will be avoided Outcome: Adequate for Discharge   Problem: Activity: Goal: Risk for  activity intolerance will decrease Outcome: Adequate for Discharge   Problem: Nutrition: Goal: Adequate nutrition will be maintained Outcome: Adequate for Discharge   Problem: Coping: Goal: Level of anxiety will decrease Outcome: Adequate for Discharge   Problem: Elimination: Goal: Will not experience complications related to bowel motility Outcome: Adequate for Discharge Goal: Will not experience complications related to urinary retention Outcome: Adequate for Discharge   Problem: Pain Managment: Goal: General experience of comfort will improve and/or be controlled Outcome: Adequate for Discharge   Problem: Safety: Goal: Ability to remain free from injury will improve Outcome: Adequate for Discharge   Problem: Skin Integrity: Goal: Risk for impaired skin integrity will decrease Outcome: Adequate for Discharge

## 2023-12-24 NOTE — Discharge Summary (Signed)
Physician Discharge Summary  Loyde Orth ZOX:[id number] DOB: 12/22/1945 DOA: 12/19/2023  PCP: Kirby Funk, MD (Inactive)  Admit date: 12/19/2023  Discharge date: 12/24/2023  Admitted From:Home  Disposition:  Home  Recommendations for Outpatient Follow-up:  Follow up with PCP in 1-2 weeks and follow-up BMP Follow up with Dr. Susa Simmonds in 2 weeks Continue on linezolid and Flagyl as prescribed for 12 more days to complete total of 2-week course of treatment Continue other home blood pressure medications and chronic medications as noted below, hold lisinopril and chlorthalidone until AKI further improves on follow-up BMP Continue sodium bicarbonate tablets for metabolic acidosis related to AKI/CKD. Pain medication prescribed for as needed pain control  Home Health: Outpatient PT  Equipment/Devices: None  Discharge Condition:Stable  CODE STATUS: DNR  Diet recommendation: Heart Healthy/carb modified  Brief/Interim Summary:  78 y.o. male with medical history significant for uncontrolled type 2 diabetes, hypertension, hyperlipidemia, CKD 3B, obesity, coronary artery disease status post PCI with stent placement presented with left fifth toe infection with concern for osteomyelitis from [facility].  On presentation, left foot x-ray revealed soft tissue swelling with destructive appearance to the phalanges of the fifth digit particularly mid and distal.  He was started on IV antibiotics.  Orthopedics was consulted.  MRI of left foot showed left fifth toe osteomyelitis.  He underwent left fifth MTP disarticulation along with I&D of left forefoot abscess and left fourth MTP arthrotomy by orthopedics on 12/21/2023.  He has had no growth on blood cultures and AKI is improving.  He will remain on antibiotics as prescribed for total 2-week course and follow-up with orthopedics outpatient.  Discharge Diagnoses:  Principal Problem:   Toe infection  Principal discharge diagnosis: Acute left fifth toe  osteomyelitis along with left forefoot abscess status post surgical intervention as described above 12/21/2023.  AKI on CKD stage IIIb  Discharge Instructions  Discharge Instructions     Diet - low sodium heart healthy   Complete by: As directed    Increase activity slowly   Complete by: As directed       Allergies as of 12/24/2023       Reactions   Januvia [sitagliptin] Other (See Comments)   pancreatitis   Ozempic (0.25 Or 0.5 Mg-dose) [semaglutide(0.25 Or 0.5mg -dos)] Other (See Comments)   Other reaction(s): abdominal pain        Medication List     STOP taking these medications    chlorthalidone 25 MG tablet Commonly known as: HYGROTON   Jardiance 25 MG Tabs tablet Generic drug: empagliflozin   lisinopril 40 MG tablet Commonly known as: ZESTRIL       TAKE these medications    amLODipine 10 MG tablet Commonly known as: NORVASC Take 10 mg by mouth daily.   aspirin 81 MG tablet Take 81 mg by mouth at bedtime.   atorvastatin 80 MG tablet Commonly known as: LIPITOR Take 40 mg by mouth at bedtime.   calcitRIOL 0.25 MCG capsule Commonly known as: ROCALTROL Take 0.25 mcg by mouth every other day. Monday, Wednesday, and Friday   clopidogrel 75 MG tablet Commonly known as: PLAVIX Take 75 mg by mouth daily.   EUCERIN EX Apply 1 application topically daily.   gabapentin 100 MG capsule Commonly known as: NEURONTIN Take 300 mg by mouth at bedtime.   glucose 4 GM chewable tablet Chew 1 tablet by mouth as needed for low blood sugar.   insulin aspart protamine- aspart (70-30) 100 UNIT/ML injection Commonly known as: NOVOLOG MIX 70/30 Inject  70 Units into the skin 2 (two) times daily before a meal.   insulin glargine-yfgn 100 UNIT/ML Pen Commonly known as: SEMGLEE Inject 38 Units into the skin 2 (two) times daily.   linezolid 600 MG tablet Commonly known as: ZYVOX Take 1 tablet (600 mg total) by mouth every 12 (twelve) hours for 12 days.   metFORMIN  1000 MG tablet Commonly known as: GLUCOPHAGE Take 1,000 mg by mouth 2 (two) times daily with a meal.   metoprolol succinate 25 MG 24 hr tablet Commonly known as: TOPROL-XL Take 0.5 tablets (12.5 mg total) by mouth daily. What changed:  See the new instructions. Another medication with the same name was removed. Continue taking this medication, and follow the directions you see here.   metroNIDAZOLE 500 MG tablet Commonly known as: FLAGYL Take 1 tablet (500 mg total) by mouth every 12 (twelve) hours for 12 days.   naloxone 4 MG/0.1ML Liqd nasal spray kit Commonly known as: NARCAN Place 1 spray into the nose as needed (opioid overdose).   NON FORMULARY Insulin Pen Needle 31G X 8 MM Miscellaneous as directed   oxyCODONE 5 MG immediate release tablet Commonly known as: Oxy IR/ROXICODONE Take 1 tablet (5 mg total) by mouth every 6 (six) hours as needed for moderate pain (pain score 4-6), breakthrough pain or severe pain (pain score 7-10).   pioglitazone 30 MG tablet Commonly known as: ACTOS Take 15 mg by mouth daily.   sodium bicarbonate 650 MG tablet Take 1 tablet (650 mg total) by mouth 3 (three) times daily.   terazosin 1 MG capsule Commonly known as: HYTRIN Take 1 mg by mouth at bedtime.   THERATEARS OP Place 1 drop into both eyes daily as needed (dry eyes).   triamcinolone cream 0.1 % Commonly known as: KENALOG Apply 1 Application topically 2 (two) times daily.   Vitamin D 50 MCG (2000 UT) tablet Take 4,000 Units by mouth daily.        Follow-up Information     Terance Hart, MD Follow up in 2 week(s).   Specialty: Orthopedic Surgery Contact information: [address] [place] [address] [phone]         Kirby Funk, MD. Schedule an appointment as soon as possible for a visit in 1 month(s).   Specialty: Internal Medicine Contact information: [address] [place] [address] [phone]                 Allergies  Allergen Reactions   Januvia [Sitagliptin] Other (See Comments)    pancreatitis   Ozempic (0.25 Or 0.5 Mg-Dose) [Semaglutide(0.25 Or 0.5mg -Dos)] Other (See Comments)    Other reaction(s): abdominal pain    Consultations: Orthopedics   Procedures/Studies: MR FOOT LEFT WO CONTRAST Result Date: 12/20/2023 CLINICAL DATA:  78 year old male with history of diabetes and ulceration. EXAM: MRI OF THE LEFT FOOT WITHOUT CONTRAST TECHNIQUE: Multiplanar, multisequence MR imaging of the left foot was performed. No intravenous contrast was administered. COMPARISON:  Same day radiographs of the left fifth toe dated 12/19/2023. FINDINGS: Bones/Joint/Cartilage There is cortical destruction of the fifth middle and distal phalanges as well as the distal half of fifth proximal phalanx with T2 hyperintense and confluent T1 hypointense marrow signal abnormality, compatible with acute osteomyelitis. The remainder of the bones demonstrate normal marrow signal. Mild-to-moderate first MTP joint space narrowing and diffuse interphalangeal joint space narrowing. Mild-to-moderate degenerative changes of the medial hallux sesamoid-first metatarsal articulation. The TMT joints are anatomically aligned with mild joint  space narrowing. Trace fourth and fifth MTP joint effusions. Ligaments Collateral ligaments are intact.  Lisfranc ligament is intact. Muscles and Tendons Visualized flexor, peroneal and extensor compartment tendons are intact. No significant tenosynovitis. Muscles are within normal limits. Soft tissue Soft tissue ulceration with surrounding edema and cutaneous thickening at the lateral aspect of fifth digit, which tracts to the level of the underlying bone. No discrete loculated collection identified. Subcutaneous edema extending along the dorsal foot. IMPRESSION: 1. Findings compatible with acute osteomyelitis of the fifth digit, including the proximal, middle, and distal phalanges, with overlying soft  tissue ulceration. No discrete loculated collection identified. 2. Trace fifth and fourth MTP joint effusions. 3. Osteoarthritis, most pronounced at the first MTP joint. Electronically Signed   By: Hart Robinsons M.D.   On: 12/20/2023 09:57   DG Toe 5th Left Result Date: 12/19/2023 CLINICAL DATA:  Diabetic ulcer. EXAM: DG TOE 5TH LEFT three views COMPARISON:  None Available. FINDINGS: Soft tissue swelling about the fifth digit with erosive destructive changes involving the middle phalanx, distal proximal phalanx and base of the distal phalanx worrisome for osteomyelitis. There is also some fragmentation. A component of injury is possible. No additional fracture or dislocation. Degenerative changes. IMPRESSION: Soft tissue swelling with destructive appearance to the phalanges of the fifth digit particularly mid and distal. Possibilities would include infection or osteomyelitis. If there is further concern of the sequela and extent of distribution, additional cross-sectional imaging with MRI or bone scan could be considered as clinically appropriate Electronically Signed   By: Karen Kays M.D.   On: 12/19/2023 13:08     Discharge Exam: Vitals:   12/23/23 2105 12/24/23 0444  BP: (!) 177/80 (!) 159/83  Pulse: 72 80  Resp: 17 18  Temp: (!) 97.5 F (36.4 C) 98.6 F (37 C)  SpO2: 96% 98%   Vitals:   12/23/23 1641 12/23/23 2105 12/24/23 0102 12/24/23 0444  BP: (!) 169/65 (!) 177/80  (!) 159/83  Pulse: 78 72  80  Resp: 18 17  18   Temp: 98.1 F (36.7 C) (!) 97.5 F (36.4 C)  98.6 F (37 C)  TempSrc: Oral Oral    SpO2: 98% 96%  98%  Weight:   115.7 kg   Height:        General: Pt is alert, awake, not in acute distress Cardiovascular: RRR, S1/S2 +, no rubs, no gallops Respiratory: CTA bilaterally, no wheezing, no rhonchi Abdominal: Soft, NT, ND, bowel sounds + Extremities: no edema, no cyanosis, left foot dressings C/D/I    The results of significant diagnostics from this  hospitalization (including imaging, microbiology, ancillary and laboratory) are listed below for reference.     Microbiology: Recent Results (from the past 240 hours)  Culture, blood (Routine X 2) w Reflex to ID Panel     Status: None   Collection Time: 12/19/23  6:51 PM   Specimen: BLOOD  Result Value Ref Range Status   Specimen Description BLOOD SITE NOT SPECIFIED  Final   Special Requests   Final    BOTTLES DRAWN AEROBIC AND ANAEROBIC Blood Culture results may not be optimal due to an inadequate volume of blood received in culture bottles   Culture   Final    NO GROWTH 5 DAYS Performed at [facility], [address] [place], [place] [address]    Report Status 12/24/2023 FINAL  Final  Culture, blood (Routine X 2) w Reflex to ID Panel     Status: None  Collection Time: 12/19/23  7:05 PM   Specimen: BLOOD  Result Value Ref Range Status   Specimen Description BLOOD SITE NOT SPECIFIED  Final   Special Requests   Final    BOTTLES DRAWN AEROBIC AND ANAEROBIC Blood Culture results may not be optimal due to an inadequate volume of blood received in culture bottles   Culture   Final    NO GROWTH 5 DAYS Performed at [facility], [address], [place], [place] [address]    Report Status 12/24/2023 FINAL  Final  Surgical pcr screen     Status: None   Collection Time: 12/20/23  6:00 PM   Specimen: Nasal Mucosa; Nasal Swab  Result Value Ref Range Status   MRSA, PCR NEGATIVE NEGATIVE Final   Staphylococcus aureus NEGATIVE NEGATIVE Final    Comment: (NOTE) The Xpert SA Assay (FDA approved for NASAL specimens in patients 85 years of age and older), is one component of a comprehensive surveillance program. It is not intended to diagnose infection nor to guide or monitor treatment. Performed at [facility], [address] [place], [place] [address]   Aerobic/Anaerobic Culture w Gram Stain (surgical/deep wound)     Status: None (Preliminary result)   Collection  Time: 12/21/23  8:00 AM   Specimen: PATH Soft tissue resection  Result Value Ref Range Status   Specimen Description TISSUE  Final   Special Requests left 5th toe  Final   Gram Stain NO WBC SEEN NO ORGANISMS SEEN   Final   Culture   Final    NO GROWTH 3 DAYS NO ANAEROBES ISOLATED; CULTURE IN PROGRESS FOR 5 DAYS Performed at [facility], [address] [place], [place] [address]    Report Status PENDING  Incomplete     Labs: BNP (last 3 results) No results for input(s): "BNP" in the last 8760 hours. Basic Metabolic Panel: Recent Labs  Lab 12/20/23 0614 12/21/23 0605 12/22/23 0403 12/23/23 0830 12/24/23 0355  NA 137 139 136 138 137  K 4.4 4.4 4.1 4.3 4.3  CL 108 110 109 112* 112*  CO2 20* 19* 18* 18* 19*  GLUCOSE 243* 154* 155* 150* 163*  BUN 55* 45* 38* 29* 27*  CREATININE 3.27* 3.21* 3.12* 2.96* 2.82*  CALCIUM 8.2* 8.4* 8.2* 8.5* 7.9*  MG 1.7 1.7 1.6* 1.5* 1.8  PHOS 4.0  --   --   --   --    Liver Function Tests: Recent Labs  Lab 12/19/23 1215 12/20/23 0614  AST 14*  --   ALT 16  --   ALKPHOS 70  --   BILITOT 0.7  --   PROT 7.2  --   ALBUMIN 3.1* 2.5*   No results for input(s): "LIPASE", "AMYLASE" in the last 168 hours. No results for input(s): "AMMONIA" in the last 168 hours. CBC: Recent Labs  Lab 12/19/23 1215 12/20/23 0614 12/21/23 0605 12/23/23 0830  WBC 9.3 6.1 5.7 7.3  NEUTROABS 7.4  --  3.5 5.0  HGB 11.0* 11.1* 11.4* 11.6*  HCT 35.3* 35.4* 36.2* 36.5*  MCV 81.9 80.8 81.0 81.3  PLT 507* 454* 465* 436*   Cardiac Enzymes: No results for input(s): "CKTOTAL", "CKMB", "CKMBINDEX", "TROPONINI" in the last 168 hours. BNP: Invalid input(s): "POCBNP" CBG: Recent Labs  Lab 12/23/23 0729 12/23/23 1224 12/23/23 1639 12/23/23 2105 12/24/23 0745  GLUCAP 137* 192* 164* 191* 177*   D-Dimer No results for input(s): "DDIMER" in the last 72 hours. Hgb A1c No  results for input(s): "HGBA1C" in the last 72 hours. Lipid Profile No results for  input(s): "CHOL", "HDL", "LDLCALC", "TRIG", "CHOLHDL", "LDLDIRECT" in the last 72 hours. Thyroid function studies No results for input(s): "TSH", "T4TOTAL", "T3FREE", "THYROIDAB" in the last 72 hours.  Invalid input(s): "FREET3" Anemia work up No results for input(s): "VITAMINB12", "FOLATE", "FERRITIN", "TIBC", "IRON", "RETICCTPCT" in the last 72 hours. Urinalysis No results found for: "COLORURINE", "APPEARANCEUR", "LABSPEC", "PHURINE", "GLUCOSEU", "HGBUR", "BILIRUBINUR", "KETONESUR", "PROTEINUR", "UROBILINOGEN", "NITRITE", "LEUKOCYTESUR" Sepsis Labs Recent Labs  Lab 12/19/23 1215 12/20/23 0614 12/21/23 0605 12/23/23 0830  WBC 9.3 6.1 5.7 7.3   Microbiology Recent Results (from the past 240 hours)  Culture, blood (Routine X 2) w Reflex to ID Panel     Status: None   Collection Time: 12/19/23  6:51 PM   Specimen: BLOOD  Result Value Ref Range Status   Specimen Description BLOOD SITE NOT SPECIFIED  Final   Special Requests   Final    BOTTLES DRAWN AEROBIC AND ANAEROBIC Blood Culture results may not be optimal due to an inadequate volume of blood received in culture bottles   Culture   Final    NO GROWTH 5 DAYS Performed at [facility], [address] [place], [place] [address]    Report Status 12/24/2023 FINAL  Final  Culture, blood (Routine X 2) w Reflex to ID Panel     Status: None   Collection Time: 12/19/23  7:05 PM   Specimen: BLOOD  Result Value Ref Range Status   Specimen Description BLOOD SITE NOT SPECIFIED  Final   Special Requests   Final    BOTTLES DRAWN AEROBIC AND ANAEROBIC Blood Culture results may not be optimal due to an inadequate volume of blood received in culture bottles   Culture   Final    NO GROWTH 5 DAYS Performed at [facility], [address] [place], [place] [address]    Report Status 12/24/2023 FINAL  Final  Surgical pcr screen     Status: None   Collection Time: 12/20/23  6:00 PM   Specimen: Nasal Mucosa; Nasal Swab  Result  Value Ref Range Status   MRSA, PCR NEGATIVE NEGATIVE Final   Staphylococcus aureus NEGATIVE NEGATIVE Final    Comment: (NOTE) The Xpert SA Assay (FDA approved for NASAL specimens in patients 43 years of age and older), is one component of a comprehensive surveillance program. It is not intended to diagnose infection nor to guide or monitor treatment. Performed at [facility], [address], [place], [place] [address]   Aerobic/Anaerobic Culture w Gram Stain (surgical/deep wound)     Status: None (Preliminary result)   Collection Time: 12/21/23  8:00 AM   Specimen: PATH Soft tissue resection  Result Value Ref Range Status   Specimen Description TISSUE  Final   Special Requests left 5th toe  Final   Gram Stain NO WBC SEEN NO ORGANISMS SEEN   Final   Culture   Final    NO GROWTH 3 DAYS NO ANAEROBES ISOLATED; CULTURE IN PROGRESS FOR 5 DAYS Performed at [facility], [address] [place], [place] [address]    Report Status PENDING  Incomplete     Time coordinating discharge: 35 minutes  SIGNED:   Erick Blinks, DO [facility] 12/24/2023, 9:30 AM  If 7PM-7AM, please contact night-coverage [email or web address]

## 2023-12-24 NOTE — TOC Transition Note (Signed)
Transition of Care [facility]) - Discharge Note   Patient Details  Name: Kyle Delgado MRN: [id number] Date of Birth: Sep 13, 1946  Transition [facility] CM/SW Contact:  Tom-Johnson, Hershal Coria, RN Phone Number: 12/24/2023, 10:41 AM   Clinical Narrative:     Patient is scheduled for discharge today.  Readmission Risk Assessment done. Hospital f/u and discharge instructions on AVS. Patient declined outpatient PT setup, states he will set up with his PCP. Brother, Pamelia Hoit to transport at discharge.  No further TOC needs noted.        Final next level of care: OP Rehab Barriers to Discharge: Barriers Resolved   Patient Goals and CMS Choice Patient states their goals for this hospitalization and ongoing recovery are:: To return home CMS Medicare.gov Compare Post Acute Care list provided to:: Patient Choice offered to / list presented to : Patient      Discharge Placement                Patient to be transferred to facility by: Brother Name of family member notified: Pamelia Hoit    Discharge Plan and Services Additional resources added to the After Visit Summary for                  DME Arranged: N/A DME Agency: NA       HH Arranged: NA HH Agency: NA        Social Drivers of Health (SDOH) Interventions SDOH Screenings   Food Insecurity: No Food Insecurity (12/20/2023)  Housing: Low Risk  (12/20/2023)  Transportation Needs: No Transportation Needs (12/20/2023)  Utilities: Not At Risk (12/20/2023)  Social Connections: Unknown (12/20/2023)  Tobacco Use: Medium Risk (12/21/2023)     Readmission Risk Interventions    12/23/2023    4:06 PM  Readmission Risk Prevention Plan  Post Dischage Appt Complete  Medication Screening Complete  Transportation Screening Complete

## 2023-12-24 NOTE — Progress Notes (Signed)
Physical Therapy Treatment and Discharge Patient Details Name: Kyle Delgado MRN: [id number] DOB: 1946-05-22 Today's Date: 12/24/2023   History of Present Illness Admitted with Left fifth toe osteomyelitis with likely forefoot abscess and fourth MTP septic arthrosis, s/p L 5th toe disarticulation, ok to bear weight in post-op shoe;  with medical history significant for uncontrolled type 2 diabetes, hypertension, hyperlipidemia, CKD 3B, obesity, coronary artery disease status post PCI with stent placement    PT Comments  Continuing work on functional mobility and activity tolerance;  Session focused on gait with RW in prep for dc home;  Walked in the hallway household distances with RW, postop shoe, and tennis shoe on the R foot to get closer even leg lengths; Overall managing quite well; Questions solicited and answered; OK for dc home from PT standpoint    If plan is discharge home, recommend the following: Assistance with cooking/housework   Can travel by private vehicle        Equipment Recommendations  None recommended by PT (His Rollator shoud be adequate)    Recommendations for Other Services       Precautions / Restrictions Restrictions LLE Weight Bearing Per Provider Order: Weight bearing as tolerated Other Position/Activity Restrictions: Flatfoot Weightbearing in post operative shoe per OP Note     Mobility  Bed Mobility                    Transfers Overall transfer level: Modified independent                 General transfer comment: Has been getting up and down in his room, and reports no overt difficulty    Ambulation/Gait Ambulation/Gait assistance: Supervision, Modified independent (Device/Increase time) Gait Distance (Feet): 85 Feet Assistive device: Rolling walker (2 wheels) (L postop shoe and R sneaker) Gait Pattern/deviations: Step-through pattern, Wide base of support       General Gait Details: Overall managing short distance amb well; we  discussed that while L foot heals it is preferred he use the RW   Stairs Stairs: Yes       General stair comments: We discussed stair negotiation; he has one step to enter and he doesn't anticipate difficulty   Wheelchair Mobility     Tilt Bed    Modified Rankin (Stroke Patients Only)       Balance Overall balance assessment: Mild deficits observed, not formally tested                                          Cognition Arousal: Alert Behavior During Therapy: WFL for tasks assessed/performed Overall Cognitive Status: Within Functional Limits for tasks assessed                                          Exercises      General Comments General comments (skin integrity, edema, etc.): Discussed Ouptpt PT follow up for strengthening and endurance; once L foot heals, this can be arranged through PCP, Ortho, or [facility]      Pertinent Vitals/Pain Pain Assessment Pain Assessment: No/denies pain    Home Living                          Prior Function  PT Goals (current goals can now be found in the care plan section) Acute Rehab PT Goals Patient Stated Goal: HOme soon PT Goal Formulation: With patient Time For Goal Achievement: 12/29/23 Potential to Achieve Goals: Good Progress towards PT goals: Goals met/education completed, patient discharged from PT    Frequency    Min 1X/week      PT Plan      Co-evaluation              AM-PAC PT "6 Clicks" Mobility   Outcome Measure  Help needed turning from your back to your side while in a flat bed without using bedrails?: None Help needed moving from lying on your back to sitting on the side of a flat bed without using bedrails?: None Help needed moving to and from a bed to a chair (including a wheelchair)?: None Help needed standing up from a chair using your arms (e.g., wheelchair or bedside chair)?: None Help needed to walk in hospital room?: None Help  needed climbing 3-5 steps with a railing? : A Little 6 Click Score: 23    End of Session   Activity Tolerance: Patient tolerated treatment well Patient left: in bed;with call bell/phone within reach Nurse Communication: Mobility status PT Visit Diagnosis: Other abnormalities of gait and mobility (R26.89);Pain Pain - Right/Left: Left Pain - part of body:  (Lateral foot)     Time: 2956-2130 PT Time Calculation (min) (ACUTE ONLY): 8 min  Charges:    $Gait Training: 8-22 mins PT General Charges $$ ACUTE PT VISIT: 1 Visit                     Kyle Delgado, PT  Acute Rehabilitation Services Office [phone] Secure Chat welcomed    Kyle Delgado 12/24/2023, 8:59 AM

## 2023-12-26 LAB — AEROBIC/ANAEROBIC CULTURE W GRAM STAIN (SURGICAL/DEEP WOUND)
Culture: NO GROWTH
Gram Stain: NONE SEEN

## 2023-12-29 DIAGNOSIS — E1121 Type 2 diabetes mellitus with diabetic nephropathy: Secondary | ICD-10-CM | POA: Diagnosis not present

## 2023-12-29 DIAGNOSIS — E1151 Type 2 diabetes mellitus with diabetic peripheral angiopathy without gangrene: Secondary | ICD-10-CM | POA: Diagnosis not present

## 2023-12-29 DIAGNOSIS — R110 Nausea: Secondary | ICD-10-CM | POA: Diagnosis not present

## 2023-12-29 DIAGNOSIS — Z89422 Acquired absence of other left toe(s): Secondary | ICD-10-CM | POA: Diagnosis not present

## 2023-12-29 DIAGNOSIS — I129 Hypertensive chronic kidney disease with stage 1 through stage 4 chronic kidney disease, or unspecified chronic kidney disease: Secondary | ICD-10-CM | POA: Diagnosis not present

## 2023-12-29 DIAGNOSIS — E1165 Type 2 diabetes mellitus with hyperglycemia: Secondary | ICD-10-CM | POA: Diagnosis not present

## 2023-12-29 DIAGNOSIS — N1832 Chronic kidney disease, stage 3b: Secondary | ICD-10-CM | POA: Diagnosis not present

## 2023-12-29 DIAGNOSIS — Z794 Long term (current) use of insulin: Secondary | ICD-10-CM | POA: Diagnosis not present
